# Patient Record
Sex: Male | Born: 1955 | Race: White | Hispanic: No | Marital: Married | Smoking: Former smoker
Health system: Southern US, Community
[De-identification: ages and names within clinical notes are randomized; demographics above are authoritative.]

## PROBLEM LIST (undated history)

## (undated) DIAGNOSIS — E119 Type 2 diabetes mellitus without complications: Secondary | ICD-10-CM

## (undated) DIAGNOSIS — C61 Malignant neoplasm of prostate: Secondary | ICD-10-CM

## (undated) DIAGNOSIS — K219 Gastro-esophageal reflux disease without esophagitis: Secondary | ICD-10-CM

## (undated) DIAGNOSIS — Z9989 Dependence on other enabling machines and devices: Secondary | ICD-10-CM

## (undated) DIAGNOSIS — G4730 Sleep apnea, unspecified: Secondary | ICD-10-CM

## (undated) DIAGNOSIS — Z973 Presence of spectacles and contact lenses: Secondary | ICD-10-CM

## (undated) DIAGNOSIS — M1990 Unspecified osteoarthritis, unspecified site: Secondary | ICD-10-CM

## (undated) HISTORY — PX: OTHER SURGICAL HISTORY: SHX169

## (undated) HISTORY — PX: NASAL SEPTUM SURGERY: SHX37

## (undated) HISTORY — PX: APPENDECTOMY: SHX54

## (undated) HISTORY — PX: TONSILLECTOMY: SUR1361

## (undated) HISTORY — PX: PROSTATE BIOPSY: SHX241

---

## 2003-06-20 ENCOUNTER — Ambulatory Visit (HOSPITAL_BASED_OUTPATIENT_CLINIC_OR_DEPARTMENT_OTHER): Admission: RE | Admit: 2003-06-20 | Discharge: 2003-06-20 | Payer: Self-pay | Admitting: Family Medicine

## 2007-06-02 ENCOUNTER — Emergency Department (HOSPITAL_COMMUNITY): Admission: EM | Admit: 2007-06-02 | Discharge: 2007-06-02 | Payer: Self-pay | Admitting: Emergency Medicine

## 2015-09-09 ENCOUNTER — Ambulatory Visit (INDEPENDENT_AMBULATORY_CARE_PROVIDER_SITE_OTHER): Payer: 59 | Admitting: Podiatry

## 2015-09-09 ENCOUNTER — Encounter: Payer: Self-pay | Admitting: Podiatry

## 2015-09-09 VITALS — BP 152/87 | HR 99 | Resp 18

## 2015-09-09 DIAGNOSIS — Q828 Other specified congenital malformations of skin: Secondary | ICD-10-CM | POA: Diagnosis not present

## 2015-09-09 NOTE — Progress Notes (Signed)
   Subjective:    Patient ID: Dan Gomez, male    DOB: 26-Oct-1955, 60 y.o.   MRN: [id number]  HPI  60 year old male presents the office in 6 concerns of a callus on the outside aspect of the left which is been ongoing the last couple months. He states he is tried to trim the area. Areas painful pressure in shoe gear. Denies any redness, drainage or any swelling. No foreign objects that he can recall stepping on. No other treatment and no other injury.   Review of Systems  All other systems reviewed and are negative.      Objective:   Physical Exam General: AAO x3, NAD  Dermatological: Hyperkeratotic lesion left foot on the lateral fifth metatarsal head over an area of the small tailors bunion. Upon debridement there is pinpoint annular hyperkeratotic lesion. No overlying ulceration. No foreign body. No drainage or pus. Other open lesions.  Vascular: Dorsalis Pedis artery and Posterior Tibial artery pedal pulses are 2/4 bilateral with immedate capillary fill time. Pedal hair growth present. No varicosities and no lower extremity edema present bilateral. There is no pain with calf compression, swelling, warmth, erythema.   Neruologic: Grossly intact via light touch bilateral. Vibratory intact via tuning fork bilateral. Protective threshold with Semmes Wienstein monofilament intact to all pedal sites bilateral. Patellar and Achilles deep tendon reflexes 2+ bilateral. No Babinski or clonus noted bilateral.   Musculoskeletal:  No pain, crepitus, or limitation noted with foot and ankle range of motion bilateral. Muscular strength 5/5 in all groups tested bilateral.  Gait: Unassisted, Nonantalgic.      Assessment & Plan:  60 year old male left foot porokeratosis -Treatment options discussed including all alternatives, risks, and complications -Etiology of symptoms were discussed -Lesion was debrided without complications or bleeding. Area was cleaned. Salicylic acid was applied followed  by a bandage. Post procedure tractions were discussed. Monitor for signs or symptoms of infection. Follow-up as needed. Discussed reoccurrence   Celesta Gentile, DPM

## 2015-12-31 DIAGNOSIS — J339 Nasal polyp, unspecified: Secondary | ICD-10-CM | POA: Diagnosis not present

## 2015-12-31 DIAGNOSIS — M25561 Pain in right knee: Secondary | ICD-10-CM | POA: Diagnosis not present

## 2015-12-31 DIAGNOSIS — M25562 Pain in left knee: Secondary | ICD-10-CM | POA: Diagnosis not present

## 2015-12-31 DIAGNOSIS — Z23 Encounter for immunization: Secondary | ICD-10-CM | POA: Diagnosis not present

## 2015-12-31 DIAGNOSIS — E119 Type 2 diabetes mellitus without complications: Secondary | ICD-10-CM | POA: Diagnosis not present

## 2016-01-15 DIAGNOSIS — G4733 Obstructive sleep apnea (adult) (pediatric): Secondary | ICD-10-CM | POA: Diagnosis not present

## 2016-01-15 DIAGNOSIS — H6121 Impacted cerumen, right ear: Secondary | ICD-10-CM | POA: Diagnosis not present

## 2016-01-15 DIAGNOSIS — J330 Polyp of nasal cavity: Secondary | ICD-10-CM | POA: Diagnosis not present

## 2016-01-15 DIAGNOSIS — H6061 Unspecified chronic otitis externa, right ear: Secondary | ICD-10-CM | POA: Diagnosis not present

## 2016-04-20 DIAGNOSIS — E7800 Pure hypercholesterolemia, unspecified: Secondary | ICD-10-CM | POA: Diagnosis not present

## 2016-04-20 DIAGNOSIS — E119 Type 2 diabetes mellitus without complications: Secondary | ICD-10-CM | POA: Diagnosis not present

## 2016-04-20 DIAGNOSIS — K219 Gastro-esophageal reflux disease without esophagitis: Secondary | ICD-10-CM | POA: Diagnosis not present

## 2016-04-20 DIAGNOSIS — G4730 Sleep apnea, unspecified: Secondary | ICD-10-CM | POA: Diagnosis not present

## 2016-05-04 DIAGNOSIS — G4733 Obstructive sleep apnea (adult) (pediatric): Secondary | ICD-10-CM | POA: Diagnosis not present

## 2016-11-02 DIAGNOSIS — E7800 Pure hypercholesterolemia, unspecified: Secondary | ICD-10-CM | POA: Diagnosis not present

## 2016-11-02 DIAGNOSIS — E119 Type 2 diabetes mellitus without complications: Secondary | ICD-10-CM | POA: Diagnosis not present

## 2016-11-02 DIAGNOSIS — Z125 Encounter for screening for malignant neoplasm of prostate: Secondary | ICD-10-CM | POA: Diagnosis not present

## 2016-11-02 DIAGNOSIS — L308 Other specified dermatitis: Secondary | ICD-10-CM | POA: Diagnosis not present

## 2016-11-02 DIAGNOSIS — K219 Gastro-esophageal reflux disease without esophagitis: Secondary | ICD-10-CM | POA: Diagnosis not present

## 2017-02-08 DIAGNOSIS — Z125 Encounter for screening for malignant neoplasm of prostate: Secondary | ICD-10-CM | POA: Diagnosis not present

## 2017-04-23 DIAGNOSIS — J0100 Acute maxillary sinusitis, unspecified: Secondary | ICD-10-CM | POA: Diagnosis not present

## 2017-04-23 DIAGNOSIS — J0110 Acute frontal sinusitis, unspecified: Secondary | ICD-10-CM | POA: Diagnosis not present

## 2017-04-24 DIAGNOSIS — R972 Elevated prostate specific antigen [PSA]: Secondary | ICD-10-CM | POA: Diagnosis not present

## 2017-04-24 DIAGNOSIS — R350 Frequency of micturition: Secondary | ICD-10-CM | POA: Diagnosis not present

## 2017-05-16 DIAGNOSIS — E119 Type 2 diabetes mellitus without complications: Secondary | ICD-10-CM | POA: Diagnosis not present

## 2017-05-16 DIAGNOSIS — K219 Gastro-esophageal reflux disease without esophagitis: Secondary | ICD-10-CM | POA: Diagnosis not present

## 2017-05-16 DIAGNOSIS — G4730 Sleep apnea, unspecified: Secondary | ICD-10-CM | POA: Diagnosis not present

## 2017-05-16 DIAGNOSIS — E7800 Pure hypercholesterolemia, unspecified: Secondary | ICD-10-CM | POA: Diagnosis not present

## 2017-05-26 DIAGNOSIS — R972 Elevated prostate specific antigen [PSA]: Secondary | ICD-10-CM | POA: Diagnosis not present

## 2017-06-02 DIAGNOSIS — C61 Malignant neoplasm of prostate: Secondary | ICD-10-CM | POA: Diagnosis not present

## 2017-06-16 DIAGNOSIS — J0100 Acute maxillary sinusitis, unspecified: Secondary | ICD-10-CM | POA: Diagnosis not present

## 2017-11-29 DIAGNOSIS — C61 Malignant neoplasm of prostate: Secondary | ICD-10-CM | POA: Diagnosis not present

## 2017-12-06 DIAGNOSIS — K219 Gastro-esophageal reflux disease without esophagitis: Secondary | ICD-10-CM | POA: Diagnosis not present

## 2017-12-06 DIAGNOSIS — Z0000 Encounter for general adult medical examination without abnormal findings: Secondary | ICD-10-CM | POA: Diagnosis not present

## 2017-12-06 DIAGNOSIS — E119 Type 2 diabetes mellitus without complications: Secondary | ICD-10-CM | POA: Diagnosis not present

## 2017-12-06 DIAGNOSIS — M150 Primary generalized (osteo)arthritis: Secondary | ICD-10-CM | POA: Diagnosis not present

## 2017-12-08 DIAGNOSIS — C61 Malignant neoplasm of prostate: Secondary | ICD-10-CM | POA: Diagnosis not present

## 2017-12-08 DIAGNOSIS — R350 Frequency of micturition: Secondary | ICD-10-CM | POA: Diagnosis not present

## 2017-12-08 DIAGNOSIS — N401 Enlarged prostate with lower urinary tract symptoms: Secondary | ICD-10-CM | POA: Diagnosis not present

## 2018-06-14 DIAGNOSIS — C61 Malignant neoplasm of prostate: Secondary | ICD-10-CM | POA: Diagnosis not present

## 2018-06-20 ENCOUNTER — Other Ambulatory Visit: Payer: Self-pay | Admitting: Urology

## 2018-06-20 DIAGNOSIS — R350 Frequency of micturition: Secondary | ICD-10-CM | POA: Diagnosis not present

## 2018-06-20 DIAGNOSIS — N401 Enlarged prostate with lower urinary tract symptoms: Secondary | ICD-10-CM | POA: Diagnosis not present

## 2018-06-20 DIAGNOSIS — C61 Malignant neoplasm of prostate: Secondary | ICD-10-CM

## 2018-06-21 DIAGNOSIS — C61 Malignant neoplasm of prostate: Secondary | ICD-10-CM | POA: Diagnosis not present

## 2018-06-21 DIAGNOSIS — E7800 Pure hypercholesterolemia, unspecified: Secondary | ICD-10-CM | POA: Diagnosis not present

## 2018-06-21 DIAGNOSIS — G4733 Obstructive sleep apnea (adult) (pediatric): Secondary | ICD-10-CM | POA: Diagnosis not present

## 2018-06-21 DIAGNOSIS — E119 Type 2 diabetes mellitus without complications: Secondary | ICD-10-CM | POA: Diagnosis not present

## 2018-07-09 ENCOUNTER — Other Ambulatory Visit: Payer: Self-pay | Admitting: Urology

## 2018-07-10 ENCOUNTER — Ambulatory Visit
Admission: RE | Admit: 2018-07-10 | Discharge: 2018-07-10 | Disposition: A | Discharge status: Home / Self Care | Payer: BLUE CROSS/BLUE SHIELD | Source: Ambulatory Visit | Attending: Urology | Admitting: Urology

## 2018-07-10 ENCOUNTER — Other Ambulatory Visit: Payer: Self-pay

## 2018-07-10 DIAGNOSIS — C61 Malignant neoplasm of prostate: Secondary | ICD-10-CM | POA: Diagnosis not present

## 2018-07-10 MED ORDER — GADOBENATE DIMEGLUMINE 529 MG/ML IV SOLN
20.0000 mL | Freq: Once | INTRAVENOUS | Status: AC | PRN
  Administered 2018-07-10: 20 mL via INTRAVENOUS

## 2018-12-13 DIAGNOSIS — K219 Gastro-esophageal reflux disease without esophagitis: Secondary | ICD-10-CM | POA: Diagnosis not present

## 2018-12-13 DIAGNOSIS — Z23 Encounter for immunization: Secondary | ICD-10-CM | POA: Diagnosis not present

## 2018-12-13 DIAGNOSIS — C61 Malignant neoplasm of prostate: Secondary | ICD-10-CM | POA: Diagnosis not present

## 2018-12-13 DIAGNOSIS — Z0000 Encounter for general adult medical examination without abnormal findings: Secondary | ICD-10-CM | POA: Diagnosis not present

## 2018-12-13 DIAGNOSIS — E119 Type 2 diabetes mellitus without complications: Secondary | ICD-10-CM | POA: Diagnosis not present

## 2018-12-13 DIAGNOSIS — Z13220 Encounter for screening for lipoid disorders: Secondary | ICD-10-CM | POA: Diagnosis not present

## 2019-01-03 DIAGNOSIS — C61 Malignant neoplasm of prostate: Secondary | ICD-10-CM | POA: Diagnosis not present

## 2019-01-03 DIAGNOSIS — R350 Frequency of micturition: Secondary | ICD-10-CM | POA: Diagnosis not present

## 2019-01-03 DIAGNOSIS — N401 Enlarged prostate with lower urinary tract symptoms: Secondary | ICD-10-CM | POA: Diagnosis not present

## 2019-03-07 DIAGNOSIS — E119 Type 2 diabetes mellitus without complications: Secondary | ICD-10-CM | POA: Diagnosis not present

## 2019-03-07 DIAGNOSIS — Z7984 Long term (current) use of oral hypoglycemic drugs: Secondary | ICD-10-CM | POA: Diagnosis not present

## 2019-03-07 DIAGNOSIS — C61 Malignant neoplasm of prostate: Secondary | ICD-10-CM | POA: Diagnosis not present

## 2019-03-07 DIAGNOSIS — E7800 Pure hypercholesterolemia, unspecified: Secondary | ICD-10-CM | POA: Diagnosis not present

## 2019-03-07 DIAGNOSIS — K219 Gastro-esophageal reflux disease without esophagitis: Secondary | ICD-10-CM | POA: Diagnosis not present

## 2019-04-19 HISTORY — PX: PROSTATE BIOPSY: SHX241

## 2019-06-10 DIAGNOSIS — C61 Malignant neoplasm of prostate: Secondary | ICD-10-CM | POA: Diagnosis not present

## 2019-06-19 DIAGNOSIS — C61 Malignant neoplasm of prostate: Secondary | ICD-10-CM | POA: Diagnosis not present

## 2019-06-19 DIAGNOSIS — R350 Frequency of micturition: Secondary | ICD-10-CM | POA: Diagnosis not present

## 2019-06-19 DIAGNOSIS — N401 Enlarged prostate with lower urinary tract symptoms: Secondary | ICD-10-CM | POA: Diagnosis not present

## 2019-07-31 DIAGNOSIS — E7800 Pure hypercholesterolemia, unspecified: Secondary | ICD-10-CM | POA: Diagnosis not present

## 2019-07-31 DIAGNOSIS — E119 Type 2 diabetes mellitus without complications: Secondary | ICD-10-CM | POA: Diagnosis not present

## 2019-07-31 DIAGNOSIS — C61 Malignant neoplasm of prostate: Secondary | ICD-10-CM | POA: Diagnosis not present

## 2019-07-31 DIAGNOSIS — K219 Gastro-esophageal reflux disease without esophagitis: Secondary | ICD-10-CM | POA: Diagnosis not present

## 2019-10-23 DIAGNOSIS — C61 Malignant neoplasm of prostate: Secondary | ICD-10-CM | POA: Diagnosis not present

## 2019-10-23 DIAGNOSIS — R350 Frequency of micturition: Secondary | ICD-10-CM | POA: Diagnosis not present

## 2019-10-23 DIAGNOSIS — N401 Enlarged prostate with lower urinary tract symptoms: Secondary | ICD-10-CM | POA: Diagnosis not present

## 2019-12-11 DIAGNOSIS — C61 Malignant neoplasm of prostate: Secondary | ICD-10-CM | POA: Diagnosis not present

## 2020-01-01 DIAGNOSIS — C61 Malignant neoplasm of prostate: Secondary | ICD-10-CM | POA: Diagnosis not present

## 2020-01-20 ENCOUNTER — Encounter: Payer: Self-pay | Admitting: Radiation Oncology

## 2020-01-20 NOTE — Progress Notes (Signed)
GU Location of Tumor / Histology: prostatic adenocarcinoma  If Prostate Cancer, Gleason Score is (4 + 3) and PSA is (5.61). Prostate volume: 54.51  Dan Gomez reports he has been aware for approximately 2 years of an elevated PSA. Patient reports his understanding that recently he has transitioned to an intermittent risk prostate cancer  Biopsies of prostate (if applicable) revealed:    Past/Anticipated interventions by urology, if any: prostate biopsy, referral for consideration of radiation therapy  Past/Anticipated interventions by medical oncology, if any: no  Weight changes, if any: no  Bowel/Bladder complaints, if any: IPSS 12. SHIM 16. Denies dysuria, hematuria or urinary leakage. Denies urinary incontinence. Denies any bowel complaints.    Nausea/Vomiting, if any: no  Pain issues, if any:  Denies new pain.   SAFETY ISSUES:  Prior radiation? denies  Pacemaker/ICD? denies  Possible current pregnancy? no, male patient  Is the patient on methotrexate? denies  Current Complaints / other details:  64 year old male. Resides in [facility]. Has one daughter and one son. Father with hx of multiple myeloma. Stopped smoking in 1988.

## 2020-01-21 ENCOUNTER — Ambulatory Visit
Admission: RE | Admit: 2020-01-21 | Discharge: 2020-01-21 | Disposition: A | Discharge status: Home / Self Care | Payer: BC Managed Care – PPO | Source: Ambulatory Visit | Attending: Radiation Oncology | Admitting: Radiation Oncology

## 2020-01-21 ENCOUNTER — Encounter: Payer: Self-pay | Admitting: Radiation Oncology

## 2020-01-21 ENCOUNTER — Other Ambulatory Visit: Payer: Self-pay

## 2020-01-21 VITALS — BP 142/82 | HR 95 | Temp 97.6°F | Resp 18 | Ht 70.0 in | Wt 271.4 lb

## 2020-01-21 DIAGNOSIS — C61 Malignant neoplasm of prostate: Secondary | ICD-10-CM | POA: Insufficient documentation

## 2020-01-21 DIAGNOSIS — Z800 Family history of malignant neoplasm of digestive organs: Secondary | ICD-10-CM | POA: Diagnosis not present

## 2020-01-21 DIAGNOSIS — Z7984 Long term (current) use of oral hypoglycemic drugs: Secondary | ICD-10-CM | POA: Insufficient documentation

## 2020-01-21 DIAGNOSIS — Z87891 Personal history of nicotine dependence: Secondary | ICD-10-CM | POA: Insufficient documentation

## 2020-01-21 DIAGNOSIS — R972 Elevated prostate specific antigen [PSA]: Secondary | ICD-10-CM | POA: Diagnosis not present

## 2020-01-21 HISTORY — DX: Malignant neoplasm of prostate: C61

## 2020-01-21 HISTORY — DX: Dependence on other enabling machines and devices: Z99.89

## 2020-01-21 NOTE — Progress Notes (Signed)
Radiation Oncology         [phone]) [phone] ________________________________  Initial outpatient Consultation  Name: Dan Gomez MRN: [id number]  Date: 01/21/2020  DOB: [birth year]  MH:[id number], [name], MD  Festus Aloe, MD   REFERRING PHYSICIAN: Festus Aloe, MD  DIAGNOSIS: 64 y.o. gentleman with Stage T1c adenocarcinoma of the prostate with Gleason score of 4+3, and PSA of 5.61.    ICD-10-CM   1. Malignant neoplasm of prostate ([place])  C61     HISTORY OF PRESENT ILLNESS: Dan Gomez is a 64 y.o. male with a diagnosis of prostate cancer. He was initially referred to Dr. Junious Silk in 04/2017 for an elevated PSA of 6.35.  Repeat PSA during consultation had shown a decrease to 4.61.  Biopsy performed on 05/26/2017 revealed Gleason 3+4 prostate cancer in 3/12 cores with low volume (10-30%).  He opted for active surveillance.  He underwent prostate MRI on 07/10/2018 showing: two right peripheral zone abnormalities, the more suspicious being a diminutive lesion within right mid to apical gland (PI-RADS 4); no findings of locally advanced or pelvic metastatic disease; prostate volume of 44 cc.  He continued on active surveillance.  His PSA remained in the 4 range until 05/2019, when it climbed to 5.76.  Repeat in 10/2019 showed stability at 5.61.  His DRE has remained benign.  The patient proceeded to fusion biopsy on 12/11/2019.  The prostate volume measured 54.51 cc by ultrasound.  Out of 16 core biopsies, 4 were positive.  The maximum Gleason score was 4+3, and this was seen in right apex and right apex lateral, with both also showing perineural invasion. Additionally, Gleason 3+4 disease was seen in both cores taken from the ROI MRI lesion at the right apex, both also with perineural invasion.  The patient reviewed the biopsy results with his urologist and he has kindly been referred today for discussion of potential radiation treatment options.   PREVIOUS RADIATION THERAPY: No  PAST  MEDICAL HISTORY:  Past Medical History:  Diagnosis Date  . CPAP (continuous positive airway pressure) dependence   . Prostate cancer ([place])       PAST SURGICAL HISTORY: Past Surgical History:  Procedure Laterality Date  . PROSTATE BIOPSY      FAMILY HISTORY:  Family History  Problem Relation Age of Onset  . Multiple myeloma Father   . Colon cancer Maternal Grandmother   . Stomach cancer Paternal Grandfather   . Prostate cancer Neg Hx   . Breast cancer Neg Hx   . Pancreatic cancer Neg Hx     SOCIAL HISTORY:  Social History   Socioeconomic History  . Marital status: Married    Spouse name: Not on file  . Number of children: 2  . Years of education: Not on file  . Highest education level: Not on file  Occupational History    Comment: full time  Tobacco Use  . Smoking status: Former Smoker    Packs/day: 1.50    Years: 12.00    Pack years: 18.00    Types: Cigarettes    Quit date: 04/18/1986    Years since quitting: 33.8  . Smokeless tobacco: Never Used  Vaping Use  . Vaping Use: Never used  Substance and Sexual Activity  . Alcohol use: No    Alcohol/week: 0.0 standard drinks  . Drug use: No  . Sexual activity: Yes  Other Topics Concern  . Not on file  Social History Narrative   2 children plus 1 stepchild   Social Determinants  of Health   Financial Resource Strain:   . Difficulty of Paying Living Expenses: Not on file  Food Insecurity:   . Worried About Charity fundraiser in the Last Year: Not on file  . Ran Out of Food in the Last Year: Not on file  Transportation Needs:   . Lack of Transportation (Medical): Not on file  . Lack of Transportation (Non-Medical): Not on file  Physical Activity:   . Days of Exercise per Week: Not on file  . Minutes of Exercise per Session: Not on file  Stress:   . Feeling of Stress : Not on file  Social Connections:   . Frequency of Communication with Friends and Family: Not on file  . Frequency of Social Gatherings with  Friends and Family: Not on file  . Attends Religious Services: Not on file  . Active Member of Clubs or Organizations: Not on file  . Attends Archivist Meetings: Not on file  . Marital Status: Not on file  Intimate Partner Violence:   . Fear of Current or Ex-Partner: Not on file  . Emotionally Abused: Not on file  . Physically Abused: Not on file  . Sexually Abused: Not on file    ALLERGIES: Bactrim [sulfamethoxazole-trimethoprim]  MEDICATIONS:  Current Outpatient Medications  Medication Sig Dispense Refill  . JARDIANCE 25 MG TABS tablet Take 25 mg by mouth daily.    . metFORMIN (GLUCOPHAGE) 500 MG tablet Take 500 mg by mouth daily with breakfast. Patient was unsure of the mg/Lisa      No current facility-administered medications for this encounter.    REVIEW OF SYSTEMS:  On review of systems, the patient reports that he is doing well overall. He denies any chest pain, shortness of breath, cough, fevers, chills, night sweats, unintended weight changes. He denies any bowel disturbances, and denies abdominal pain, nausea or vomiting. He denies any new musculoskeletal or joint aches or pains. His IPSS was 12, indicating moderate urinary symptoms. His SHIM was 16, indicating he has moderate erectile dysfunction. A complete review of systems is obtained and is otherwise negative.    PHYSICAL EXAM:  Wt Readings from Last 3 Encounters:  01/21/20 271 lb 6 oz (123.1 kg)   Temp Readings from Last 3 Encounters:  01/21/20 97.6 F (36.4 C) (Tympanic)   BP Readings from Last 3 Encounters:  01/21/20 (!) 142/82  09/09/15 (!) 152/87   Pulse Readings from Last 3 Encounters:  01/21/20 95  09/09/15 99   Pain Assessment Pain Score: 0-No pain/10  In general this is a well appearing Caucasian male in no acute distress. He is alert and oriented x4 and appropriate throughout the examination. HEENT reveals that the patient is normocephalic, atraumatic. EOMs are intact. PERRLA. Skin is  intact without any evidence of gross lesions. Cardiopulmonary assessment is negative for acute distress and he exhibits normal effort.  Abdomen has active bowel sounds in all quadrants and is intact. The abdomen is soft, non tender, non distended. Lower extremities are negative for pretibial pitting edema, deep calf tenderness, cyanosis or clubbing.   KPS = 90  100 - Normal; no complaints; no evidence of disease. 90   - Able to carry on normal activity; minor signs or symptoms of disease. 80   - Normal activity with effort; some signs or symptoms of disease. 18   - Cares for self; unable to carry on normal activity or to do active work. 60   - Requires occasional assistance, but is  able to care for most of his personal needs. 50   - Requires considerable assistance and frequent medical care. 16   - Disabled; requires special care and assistance. 77   - Severely disabled; hospital admission is indicated although death not imminent. 80   - Very sick; hospital admission necessary; active supportive treatment necessary. 10   - Moribund; fatal processes progressing rapidly. 0     - Dead  Karnofsky DA, Abelmann WH, Craver LS and Burchenal JH [phone]) The use of the nitrogen mustards in the palliative treatment of carcinoma: with particular reference to bronchogenic carcinoma Cancer 1 634-56  LABORATORY DATA:  No results found for: WBC, HGB, HCT, MCV, PLT No results found for: NA, K, CL, CO2 No results found for: ALT, AST, GGT, ALKPHOS, BILITOT   RADIOGRAPHY: No results found.    IMPRESSION/PLAN: 1. 64 y.o. gentleman with Stage T1c adenocarcinoma of the prostate with Gleason Score of 4+3, and PSA of 5.61. We discussed the patient's workup and outlined the nature of prostate cancer in this setting. The patient's T stage, Gleason's score, and PSA put him into the unfavorable intermediate risk group. Accordingly, he is eligible for a variety of potential treatment options including brachytherapy,  5.5 weeks of external radiation, or prostatectomy. We discussed the available radiation techniques, and focused on the details and logistics of delivery. We discussed and outlined the risks, benefits, short and long-term effects associated with radiotherapy and compared and contrasted these with prostatectomy. We discussed the role of SpaceOAR gel in reducing the rectal toxicity associated with radiotherapy. We also detailed the role of ADT in the treatment of higher risk prostate cancer and outlined the associated side effects that could be expected with this therapy. Given the small volume of 4+3 disease on biopsy, we feel that the negative impact on quality of life secondary to side effects, outweighs the minimal potential benefit and therefore do not recommend concurrent ADT.  He appears to have a good understanding of his disease and our treatment recommendations which are of curative intent.  He was encouraged to ask questions that were answered to his stated satisfaction.  At the conclusion of our conversation, the patient is interested in moving forward with brachytherapy and use of SpaceOAR gel to reduce rectal toxicity from radiotherapy.  We will share our discussion with Dr. Junious Silk and move forward with scheduling his [facility] planning appointment in the near future.  The patient with be contacted by Romie Jumper in our office who will be working closely with him to coordinate OR scheduling and pre and post procedure appointments.  We will contact the pharmaceutical rep to ensure that [place] is available at the time of procedure.  We enjoyed meeting him today and look forward to continuing to participate in his care.Nicholos Johns, PA-C    Tyler Pita, MD  [facility] Direct Dial: [phone]  Fax: [phone] [email or web address]  Skype  [email or web address]   This document serves as a record of services personally performed by Tyler Pita, MD and Freeman Caldron, PA-C. It was created on their behalf by Wilburn Mylar, a trained medical scribe. The creation of this record is based on the scribe's personal observations and the provider's statements to them. This document has been checked and approved by the attending provider.

## 2020-01-22 ENCOUNTER — Telehealth: Payer: Self-pay | Admitting: *Deleted

## 2020-01-22 NOTE — Telephone Encounter (Signed)
Called patient to ask questions, spoke with patient 

## 2020-01-27 ENCOUNTER — Telehealth: Payer: Self-pay | Admitting: *Deleted

## 2020-01-27 NOTE — Telephone Encounter (Signed)
CALLED PATIENT TO UPDATE, SPOKE WITH PATIENT 

## 2020-01-30 ENCOUNTER — Other Ambulatory Visit: Payer: Self-pay | Admitting: Urology

## 2020-01-31 ENCOUNTER — Telehealth: Payer: Self-pay | Admitting: *Deleted

## 2020-01-31 NOTE — Telephone Encounter (Signed)
CALLED PATIENT TO INFORM OF IMPLANT DATE, SPOKE WITH PATIENT AND HE IS AWARE OF THIS DATE 

## 2020-02-05 ENCOUNTER — Telehealth: Payer: Self-pay | Admitting: *Deleted

## 2020-02-05 NOTE — Telephone Encounter (Signed)
CALLED PATIENT TO ASK ABOUT WAITING UNTIL 12-21 TO DO PRE-SEED APPTS. DUE TO THE FACT THAT HE WANTS TO WAIT UNTIL NEXT YEAR TO DO THE IMPLANT

## 2020-02-06 ENCOUNTER — Ambulatory Visit: Payer: BC Managed Care – PPO | Admitting: Radiation Oncology

## 2020-02-06 ENCOUNTER — Encounter (HOSPITAL_COMMUNITY): Payer: BC Managed Care – PPO

## 2020-02-06 ENCOUNTER — Ambulatory Visit: Payer: BC Managed Care – PPO | Admitting: Urology

## 2020-02-06 ENCOUNTER — Encounter: Payer: Self-pay | Admitting: Medical Oncology

## 2020-02-13 NOTE — Progress Notes (Signed)
Called patient to introduce myself as the prostate nurse navigator and discuss my role. He has chosen brachytherapy to treat his prostate cancer. He would like to wait until the first of the year to proceed. I asked him to call me with questions or concerns.

## 2020-03-11 ENCOUNTER — Other Ambulatory Visit: Payer: Self-pay | Admitting: Urology

## 2020-03-20 ENCOUNTER — Ambulatory Visit (HOSPITAL_BASED_OUTPATIENT_CLINIC_OR_DEPARTMENT_OTHER): Admit: 2020-03-20 | Payer: BC Managed Care – PPO | Admitting: Urology

## 2020-03-20 ENCOUNTER — Encounter (HOSPITAL_BASED_OUTPATIENT_CLINIC_OR_DEPARTMENT_OTHER): Payer: Self-pay

## 2020-03-20 SURGERY — INSERTION, RADIATION SOURCE, PROSTATE
Anesthesia: General

## 2020-04-16 ENCOUNTER — Ambulatory Visit: Payer: BC Managed Care – PPO | Admitting: Radiation Oncology

## 2020-04-16 ENCOUNTER — Ambulatory Visit: Payer: Self-pay | Admitting: Urology

## 2020-04-22 ENCOUNTER — Telehealth: Payer: Self-pay | Admitting: *Deleted

## 2020-04-22 NOTE — Telephone Encounter (Signed)
CALLED PATIENT TO REMIND OF PRE-SEED APPTS. FOR 04-23-20, SPOKE WITH PATIENT AND HE IS AWARE OF THESE APPTS.

## 2020-04-22 NOTE — Progress Notes (Signed)
  Radiation Oncology         [phone] ________________________________  Name: Dan Gomez MRN: [id number]  Date: 04/23/2020  DOB: Dec 24, 1955  SIMULATION AND TREATMENT PLANNING NOTE PUBIC ARCH STUDY  MG:NOIBBC, Lum Keas, MD  Jerilee Field, MD  DIAGNOSIS: 65 y.o. gentleman with Stage T1c adenocarcinoma of the prostate with Gleason score of 4+3, and PSA of 5.61.  Oncology History   No history exists.      ICD-10-CM   1. Malignant neoplasm of prostate ([facility])  C61     COMPLEX SIMULATION:  The patient presented today for evaluation for possible prostate seed implant. He was brought to the radiation planning suite and placed supine on the CT couch. A 3-dimensional image study set was obtained in upload to the planning computer. There, on each axial slice, I contoured the prostate gland. Then, using three-dimensional radiation planning tools I reconstructed the prostate in view of the structures from the transperineal needle pathway to assess for possible pubic arch interference. In doing so, I did not appreciate any pubic arch interference. Also, the patient's prostate volume was estimated based on the drawn structure. The volume was 54 cc.  Given the pubic arch appearance and prostate volume, patient remains a good candidate to proceed with prostate seed implant. Today, he freely provided informed written consent to proceed.    PLAN: The patient will undergo prostate seed implant.   ________________________________  Artist Pais. Kathrynn Running, M.D.

## 2020-04-23 ENCOUNTER — Encounter: Payer: Self-pay | Admitting: Medical Oncology

## 2020-04-23 ENCOUNTER — Ambulatory Visit
Admission: RE | Admit: 2020-04-23 | Discharge: 2020-04-23 | Disposition: A | Discharge status: Home / Self Care | Payer: BC Managed Care – PPO | Source: Ambulatory Visit | Attending: Urology | Admitting: Urology

## 2020-04-23 ENCOUNTER — Other Ambulatory Visit: Payer: Self-pay

## 2020-04-23 ENCOUNTER — Ambulatory Visit
Admission: RE | Admit: 2020-04-23 | Discharge: 2020-04-23 | Disposition: A | Discharge status: Home / Self Care | Payer: BC Managed Care – PPO | Source: Ambulatory Visit | Attending: Radiation Oncology | Admitting: Radiation Oncology

## 2020-04-23 ENCOUNTER — Encounter (HOSPITAL_COMMUNITY)
Admission: RE | Admit: 2020-04-23 | Discharge: 2020-04-23 | Disposition: A | Discharge status: Home / Self Care | Payer: BC Managed Care – PPO | Source: Ambulatory Visit | Attending: Urology | Admitting: Urology

## 2020-04-23 ENCOUNTER — Ambulatory Visit (HOSPITAL_COMMUNITY)
Admission: RE | Admit: 2020-04-23 | Discharge: 2020-04-23 | Disposition: A | Discharge status: Home / Self Care | Payer: BC Managed Care – PPO | Source: Ambulatory Visit | Attending: Urology | Admitting: Urology

## 2020-04-23 DIAGNOSIS — C61 Malignant neoplasm of prostate: Secondary | ICD-10-CM

## 2020-04-23 DIAGNOSIS — Z01818 Encounter for other preprocedural examination: Secondary | ICD-10-CM | POA: Diagnosis not present

## 2020-05-11 ENCOUNTER — Telehealth: Payer: Self-pay | Admitting: *Deleted

## 2020-05-11 NOTE — Telephone Encounter (Signed)
Called patient to remnd of lab and Covid testing for 05-13-20, spoke with patient and he is aware of these appts

## 2020-05-13 ENCOUNTER — Other Ambulatory Visit: Payer: Self-pay

## 2020-05-13 ENCOUNTER — Encounter (HOSPITAL_BASED_OUTPATIENT_CLINIC_OR_DEPARTMENT_OTHER): Payer: Self-pay | Admitting: Urology

## 2020-05-13 ENCOUNTER — Encounter (HOSPITAL_COMMUNITY)
Admission: RE | Admit: 2020-05-13 | Discharge: 2020-05-13 | Disposition: A | Discharge status: Home / Self Care | Payer: BC Managed Care – PPO | Source: Ambulatory Visit | Attending: Urology | Admitting: Urology

## 2020-05-13 ENCOUNTER — Other Ambulatory Visit (HOSPITAL_COMMUNITY)
Admission: RE | Admit: 2020-05-13 | Discharge: 2020-05-13 | Disposition: A | Discharge status: Home / Self Care | Payer: BC Managed Care – PPO | Source: Ambulatory Visit | Attending: Urology | Admitting: Urology

## 2020-05-13 ENCOUNTER — Other Ambulatory Visit (HOSPITAL_COMMUNITY): Payer: BC Managed Care – PPO

## 2020-05-13 DIAGNOSIS — Z6839 Body mass index (BMI) 39.0-39.9, adult: Secondary | ICD-10-CM | POA: Diagnosis not present

## 2020-05-13 DIAGNOSIS — Z20822 Contact with and (suspected) exposure to covid-19: Secondary | ICD-10-CM | POA: Insufficient documentation

## 2020-05-13 DIAGNOSIS — Z79899 Other long term (current) drug therapy: Secondary | ICD-10-CM | POA: Diagnosis not present

## 2020-05-13 DIAGNOSIS — Z800 Family history of malignant neoplasm of digestive organs: Secondary | ICD-10-CM | POA: Diagnosis not present

## 2020-05-13 DIAGNOSIS — C61 Malignant neoplasm of prostate: Secondary | ICD-10-CM | POA: Diagnosis not present

## 2020-05-13 DIAGNOSIS — Z807 Family history of other malignant neoplasms of lymphoid, hematopoietic and related tissues: Secondary | ICD-10-CM | POA: Diagnosis not present

## 2020-05-13 DIAGNOSIS — Z87891 Personal history of nicotine dependence: Secondary | ICD-10-CM | POA: Diagnosis not present

## 2020-05-13 DIAGNOSIS — Z7984 Long term (current) use of oral hypoglycemic drugs: Secondary | ICD-10-CM | POA: Diagnosis not present

## 2020-05-13 DIAGNOSIS — Z881 Allergy status to other antibiotic agents status: Secondary | ICD-10-CM | POA: Diagnosis not present

## 2020-05-13 DIAGNOSIS — Z01812 Encounter for preprocedural laboratory examination: Secondary | ICD-10-CM | POA: Insufficient documentation

## 2020-05-13 LAB — COMPREHENSIVE METABOLIC PANEL
ALT: 59 U/L — ABNORMAL HIGH (ref 0–44)
AST: 40 U/L (ref 15–41)
Albumin: 3.9 g/dL (ref 3.5–5.0)
Alkaline Phosphatase: 77 U/L (ref 38–126)
Anion gap: 11 (ref 5–15)
BUN: 14 mg/dL (ref 8–23)
CO2: 26 mmol/L (ref 22–32)
Calcium: 9 mg/dL (ref 8.9–10.3)
Chloride: 103 mmol/L (ref 98–111)
Creatinine, Ser: 0.98 mg/dL (ref 0.61–1.24)
GFR, Estimated: >60 mL/min (ref >60)
Glucose, Bld: 158 mg/dL — ABNORMAL HIGH (ref 70–99)
Potassium: 4.5 mmol/L (ref 3.5–5.1)
Sodium: 140 mmol/L (ref 135–145)
Total Bilirubin: 0.6 mg/dL (ref 0.3–1.2)
Total Protein: 7.2 g/dL (ref 6.5–8.1)

## 2020-05-13 LAB — CBC
HCT: 51.8 % (ref 39.0–52.0)
Hemoglobin: 17.4 g/dL — ABNORMAL HIGH (ref 13.0–17.0)
MCH: 29.7 pg (ref 26.0–34.0)
MCHC: 33.6 g/dL (ref 30.0–36.0)
MCV: 88.5 fL (ref 80.0–100.0)
Platelets: 267 10*3/uL (ref 150–400)
RBC: 5.85 MIL/uL — ABNORMAL HIGH (ref 4.22–5.81)
RDW: 14.8 % (ref 11.5–15.5)
WBC: 8.1 10*3/uL (ref 4.0–10.5)
nRBC: 0 % (ref 0.0–0.2)

## 2020-05-13 LAB — PROTIME-INR
INR: 1.1 (ref 0.8–1.2)
Prothrombin Time: 13.6 seconds (ref 11.4–15.2)

## 2020-05-13 LAB — APTT: aPTT: 28 seconds (ref 24–36)

## 2020-05-13 LAB — SARS CORONAVIRUS 2 (TAT 6-24 HRS): SARS Coronavirus 2: NEGATIVE

## 2020-05-13 NOTE — Progress Notes (Signed)
Spoke w/ via phone for pre-op interview---pt Lab needs dos----   none            Lab results------ekg 04-23-2020 epic, chest xray 04-23-2020 epic, cbc, cmet, pt, ptt  05-13-2020 epic COVID test ------05-13-2020 1000 Arrive at -------700 am NPO after MN NO Solid Food.  Clear liquids from MN until---600 am then npo Medications to take morning of surgery -----prevacid Diabetic medication -----none day of surgery Patient Special Instructions -----fleets enema day of surgery Pre-Op special Istructions -----bring cpap mask tubing and machine day of surgery Patient verbalized understanding of instructions that were given at this phone interview. Patient denies shortness of breath, chest pain, fever, cough at this phone interview.

## 2020-05-14 ENCOUNTER — Telehealth: Payer: Self-pay | Admitting: *Deleted

## 2020-05-14 NOTE — Progress Notes (Signed)
  Radiation Oncology         [phone] ________________________________  Name: Dan Gomez MRN: [id number]  Date: 05/14/2020  DOB: 03/05/1956       Prostate Seed Implant  TT:[id number], [name], MD  No ref. provider found  DIAGNOSIS:   65 y.o. gentleman with Stage T1c adenocarcinoma of the prostate with Gleason score of 4+3, and PSA of 5.61  PROCEDURE: Insertion of radioactive I-125 seeds into the prostate gland.  RADIATION DOSE: 145 Gy, definitive therapy.  TECHNIQUE: Dan Gomez was brought to the operating room with the urologist. He was placed in the dorsolithotomy position. He was catheterized and a rectal tube was inserted. The perineum was shaved, prepped and draped. The ultrasound probe was then introduced into the rectum to see the prostate gland.  TREATMENT DEVICE: A needle grid was attached to the ultrasound probe stand and anchor needles were placed.  3D PLANNING: The prostate was imaged in 3D using a sagittal sweep of the prostate probe. These images were transferred to the planning computer. There, the prostate, urethra and rectum were defined on each axial reconstructed image. Then, the software created an optimized 3D plan and a few seed positions were adjusted. The quality of the plan was reviewed using [facility] information for the target and the following two organs at risk:  Urethra and Rectum.  Then the accepted plan was printed and handed off to the radiation therapist.  Under my supervision, the custom loading of the seeds and spacers was carried out and loaded into sealed vicryl sleeves.  These pre-loaded needles were then placed into the needle holder.Marland Kitchen  PROSTATE VOLUME STUDY:  Using transrectal ultrasound the volume of the prostate was verified to be 58.9 cc.  SPECIAL TREATMENT PROCEDURE/SUPERVISION AND HANDLING: The pre-loaded needles were then delivered under sagittal guidance. A total of 19 needles were used to deposit 69 seeds in the prostate gland. The  individual seed activity was 0.571 mCi.  SpaceOAR:  Yes  COMPLEX SIMULATION: At the end of the procedure, an anterior radiograph of the pelvis was obtained to document seed positioning and count. Cystoscopy was performed to check the urethra and bladder.  MICRODOSIMETRY: At the end of the procedure, the patient was emitting 0.108 mR/hr at 1 meter. Accordingly, he was considered safe for hospital discharge.  PLAN: The patient will return to the radiation oncology clinic for post implant CT dosimetry in three weeks.   ________________________________  Sheral Apley Tammi Klippel, M.D.

## 2020-05-14 NOTE — Telephone Encounter (Signed)
CALLED PATIENT TO REMIND OF PROCEDURE FOR 05-15-20, SPOKE WITH PATIENT AND HE IS AWARE OF THIS PROCEDURE

## 2020-05-15 ENCOUNTER — Ambulatory Visit (HOSPITAL_BASED_OUTPATIENT_CLINIC_OR_DEPARTMENT_OTHER): Payer: BC Managed Care – PPO | Admitting: Anesthesiology

## 2020-05-15 ENCOUNTER — Encounter (HOSPITAL_BASED_OUTPATIENT_CLINIC_OR_DEPARTMENT_OTHER): Payer: Self-pay | Admitting: Urology

## 2020-05-15 ENCOUNTER — Encounter (HOSPITAL_BASED_OUTPATIENT_CLINIC_OR_DEPARTMENT_OTHER)
Admission: RE | Disposition: A | Discharge status: Home / Self Care | Payer: Self-pay | Source: Home / Self Care | Attending: Urology

## 2020-05-15 ENCOUNTER — Ambulatory Visit (HOSPITAL_COMMUNITY): Payer: BC Managed Care – PPO

## 2020-05-15 ENCOUNTER — Ambulatory Visit (HOSPITAL_BASED_OUTPATIENT_CLINIC_OR_DEPARTMENT_OTHER)
Admission: RE | Admit: 2020-05-15 | Discharge: 2020-05-15 | Disposition: A | Discharge status: Home / Self Care | Payer: BC Managed Care – PPO | Attending: Urology | Admitting: Urology

## 2020-05-15 DIAGNOSIS — Z7984 Long term (current) use of oral hypoglycemic drugs: Secondary | ICD-10-CM | POA: Diagnosis not present

## 2020-05-15 DIAGNOSIS — Z79899 Other long term (current) drug therapy: Secondary | ICD-10-CM | POA: Diagnosis not present

## 2020-05-15 DIAGNOSIS — Z87891 Personal history of nicotine dependence: Secondary | ICD-10-CM | POA: Insufficient documentation

## 2020-05-15 DIAGNOSIS — Z807 Family history of other malignant neoplasms of lymphoid, hematopoietic and related tissues: Secondary | ICD-10-CM | POA: Insufficient documentation

## 2020-05-15 DIAGNOSIS — G4730 Sleep apnea, unspecified: Secondary | ICD-10-CM | POA: Diagnosis not present

## 2020-05-15 DIAGNOSIS — Z20822 Contact with and (suspected) exposure to covid-19: Secondary | ICD-10-CM | POA: Diagnosis not present

## 2020-05-15 DIAGNOSIS — K219 Gastro-esophageal reflux disease without esophagitis: Secondary | ICD-10-CM | POA: Diagnosis not present

## 2020-05-15 DIAGNOSIS — C61 Malignant neoplasm of prostate: Secondary | ICD-10-CM | POA: Diagnosis not present

## 2020-05-15 DIAGNOSIS — Z800 Family history of malignant neoplasm of digestive organs: Secondary | ICD-10-CM | POA: Insufficient documentation

## 2020-05-15 DIAGNOSIS — E119 Type 2 diabetes mellitus without complications: Secondary | ICD-10-CM | POA: Diagnosis not present

## 2020-05-15 DIAGNOSIS — Z881 Allergy status to other antibiotic agents status: Secondary | ICD-10-CM | POA: Diagnosis not present

## 2020-05-15 DIAGNOSIS — Z6839 Body mass index (BMI) 39.0-39.9, adult: Secondary | ICD-10-CM | POA: Insufficient documentation

## 2020-05-15 HISTORY — DX: Type 2 diabetes mellitus without complications: E11.9

## 2020-05-15 HISTORY — DX: Unspecified osteoarthritis, unspecified site: M19.90

## 2020-05-15 HISTORY — PX: SPACE OAR INSTILLATION: SHX6769

## 2020-05-15 HISTORY — DX: Gastro-esophageal reflux disease without esophagitis: K21.9

## 2020-05-15 HISTORY — DX: Presence of spectacles and contact lenses: Z97.3

## 2020-05-15 HISTORY — DX: Sleep apnea, unspecified: G47.30

## 2020-05-15 HISTORY — PX: RADIOACTIVE SEED IMPLANT: SHX5150

## 2020-05-15 LAB — GLUCOSE, CAPILLARY
Glucose-Capillary: 160 mg/dL — ABNORMAL HIGH (ref 70–99)
Glucose-Capillary: 190 mg/dL — ABNORMAL HIGH (ref 70–99)

## 2020-05-15 SURGERY — INSERTION, RADIATION SOURCE, PROSTATE
Anesthesia: General | Site: Rectum

## 2020-05-15 MED ORDER — OXYCODONE HCL 5 MG/5ML PO SOLN: 5.0000 mg | Freq: Once | ORAL | Status: DC | PRN

## 2020-05-15 MED ORDER — ONDANSETRON HCL 4 MG/2ML IJ SOLN
INTRAMUSCULAR | Status: AC
  Filled 2020-05-15: qty 2

## 2020-05-15 MED ORDER — OXYCODONE HCL 5 MG PO TABS: 5.0000 mg | ORAL_TABLET | Freq: Once | ORAL | Status: DC | PRN

## 2020-05-15 MED ORDER — ONDANSETRON HCL 4 MG/2ML IJ SOLN
INTRAMUSCULAR | Status: DC | PRN
  Administered 2020-05-15: 4 mg via INTRAVENOUS

## 2020-05-15 MED ORDER — PROPOFOL 10 MG/ML IV BOLUS
INTRAVENOUS | Status: AC
  Filled 2020-05-15: qty 20

## 2020-05-15 MED ORDER — FENTANYL CITRATE (PF) 100 MCG/2ML IJ SOLN
INTRAMUSCULAR | Status: AC
  Filled 2020-05-15: qty 2

## 2020-05-15 MED ORDER — MIDAZOLAM HCL 5 MG/5ML IJ SOLN
INTRAMUSCULAR | Status: DC | PRN
  Administered 2020-05-15: 2 mg via INTRAVENOUS

## 2020-05-15 MED ORDER — SODIUM CHLORIDE (PF) 0.9 % IJ SOLN
INTRAMUSCULAR | Status: DC | PRN
  Administered 2020-05-15: 50 mL

## 2020-05-15 MED ORDER — LIDOCAINE HCL (CARDIAC) PF 100 MG/5ML IV SOSY
PREFILLED_SYRINGE | INTRAVENOUS | Status: DC | PRN
  Administered 2020-05-15: 40 mg via INTRAVENOUS
  Administered 2020-05-15: 60 mg via INTRAVENOUS

## 2020-05-15 MED ORDER — CIPROFLOXACIN IN D5W 400 MG/200ML IV SOLN
400.0000 mg | INTRAVENOUS | Status: AC
  Administered 2020-05-15: 400 mg via INTRAVENOUS

## 2020-05-15 MED ORDER — LACTATED RINGERS IV SOLN: INTRAVENOUS | Status: DC

## 2020-05-15 MED ORDER — IOHEXOL 300 MG/ML  SOLN
INTRAMUSCULAR | Status: DC | PRN
  Administered 2020-05-15: 5 mL via URETHRAL

## 2020-05-15 MED ORDER — HYDROCODONE-ACETAMINOPHEN 7.5-325 MG PO TABS: 1.0000 | ORAL_TABLET | Freq: Four times a day (QID) | ORAL | 0 refills | Status: DC | PRN

## 2020-05-15 MED ORDER — DEXAMETHASONE SODIUM PHOSPHATE 4 MG/ML IJ SOLN
INTRAMUSCULAR | Status: DC | PRN
  Administered 2020-05-15: 5 mg via INTRAVENOUS

## 2020-05-15 MED ORDER — SUCCINYLCHOLINE CHLORIDE 200 MG/10ML IV SOSY
PREFILLED_SYRINGE | INTRAVENOUS | Status: AC
  Filled 2020-05-15: qty 10

## 2020-05-15 MED ORDER — PHENYLEPHRINE 40 MCG/ML (10ML) SYRINGE FOR IV PUSH (FOR BLOOD PRESSURE SUPPORT)
PREFILLED_SYRINGE | INTRAVENOUS | Status: DC | PRN
  Administered 2020-05-15: 80 ug via INTRAVENOUS

## 2020-05-15 MED ORDER — CEPHALEXIN 500 MG PO CAPS: 500.0000 mg | ORAL_CAPSULE | Freq: Two times a day (BID) | ORAL | 0 refills | Status: DC

## 2020-05-15 MED ORDER — SODIUM CHLORIDE 0.9 % IV SOLN
INTRAVENOUS | Status: AC | PRN
  Administered 2020-05-15: 1000 mL

## 2020-05-15 MED ORDER — FENTANYL CITRATE (PF) 100 MCG/2ML IJ SOLN: 25.0000 ug | INTRAMUSCULAR | Status: DC | PRN

## 2020-05-15 MED ORDER — MIDAZOLAM HCL 2 MG/2ML IJ SOLN
INTRAMUSCULAR | Status: AC
  Filled 2020-05-15: qty 2

## 2020-05-15 MED ORDER — ALFUZOSIN HCL ER 10 MG PO TB24: 10.0000 mg | ORAL_TABLET | Freq: Every day | ORAL | 0 refills | Status: AC

## 2020-05-15 MED ORDER — FLEET ENEMA 7-19 GM/118ML RE ENEM: 1.0000 | ENEMA | Freq: Once | RECTAL | Status: DC

## 2020-05-15 MED ORDER — LIDOCAINE HCL (PF) 2 % IJ SOLN
INTRAMUSCULAR | Status: AC
  Filled 2020-05-15: qty 5

## 2020-05-15 MED ORDER — DEXAMETHASONE SODIUM PHOSPHATE 10 MG/ML IJ SOLN
INTRAMUSCULAR | Status: AC
  Filled 2020-05-15: qty 1

## 2020-05-15 MED ORDER — AMISULPRIDE (ANTIEMETIC) 5 MG/2ML IV SOLN: 10.0000 mg | Freq: Once | INTRAVENOUS | Status: DC | PRN

## 2020-05-15 MED ORDER — PROPOFOL 10 MG/ML IV BOLUS
INTRAVENOUS | Status: DC | PRN
  Administered 2020-05-15: 200 mg via INTRAVENOUS
  Administered 2020-05-15: 100 mg via INTRAVENOUS

## 2020-05-15 MED ORDER — FENTANYL CITRATE (PF) 100 MCG/2ML IJ SOLN
INTRAMUSCULAR | Status: DC | PRN
  Administered 2020-05-15 (×2): 50 ug via INTRAVENOUS

## 2020-05-15 MED ORDER — ONDANSETRON HCL 4 MG/2ML IJ SOLN: 4.0000 mg | Freq: Once | INTRAMUSCULAR | Status: DC | PRN

## 2020-05-15 MED ORDER — CIPROFLOXACIN IN D5W 400 MG/200ML IV SOLN
INTRAVENOUS | Status: AC
  Filled 2020-05-15: qty 200

## 2020-05-15 MED ORDER — SUCCINYLCHOLINE CHLORIDE 20 MG/ML IJ SOLN
INTRAMUSCULAR | Status: DC | PRN
  Administered 2020-05-15: 160 mg via INTRAVENOUS

## 2020-05-15 SURGICAL SUPPLY — 39 items
BAG DRN RND TRDRP ANRFLXCHMBR (UROLOGICAL SUPPLIES) ×1
BAG URINE DRAIN 2000ML AR STRL (UROLOGICAL SUPPLIES) ×2 IMPLANT
BLADE CLIPPER SENSICLIP SURGIC (BLADE) ×2 IMPLANT
CATH FOLEY 2WAY SLVR  5CC 16FR (CATHETERS) ×1
CATH FOLEY 2WAY SLVR  5CC 20FR (CATHETERS) ×1
CATH FOLEY 2WAY SLVR 5CC 16FR (CATHETERS) ×1 IMPLANT
CATH FOLEY 2WAY SLVR 5CC 20FR (CATHETERS) ×1 IMPLANT
CATH ROBINSON RED A/P 16FR (CATHETERS) IMPLANT
CATH ROBINSON RED A/P 20FR (CATHETERS) ×2 IMPLANT
CLOTH BEACON ORANGE TIMEOUT ST (SAFETY) ×2 IMPLANT
CNTNR URN SCR LID CUP LEK RST (MISCELLANEOUS) ×2 IMPLANT
CONT SPEC 4OZ STRL OR WHT (MISCELLANEOUS) ×4
COVER BACK TABLE 60X90IN (DRAPES) ×2 IMPLANT
COVER MAYO STAND STRL (DRAPES) ×2 IMPLANT
DRAPE C-ARM 35X43 STRL (DRAPES) ×2 IMPLANT
DRSG TEGADERM 4X4.75 (GAUZE/BANDAGES/DRESSINGS) ×4 IMPLANT
DRSG TEGADERM 8X12 (GAUZE/BANDAGES/DRESSINGS) ×4 IMPLANT
GLOVE BIO SURGEON STRL SZ7.5 (GLOVE) ×2 IMPLANT
GLOVE BIO SURGEON STRL SZ8 (GLOVE) IMPLANT
GLOVE SURG ENC MOIS LTX SZ6.5 (GLOVE) ×2 IMPLANT
GLOVE SURG ORTHO 8.5 STRL (GLOVE) ×2 IMPLANT
GLOVE SURG SS PI 6.5 STRL IVOR (GLOVE) IMPLANT
GOWN STRL REUS W/TWL LRG LVL3 (GOWN DISPOSABLE) ×2 IMPLANT
HOLDER FOLEY CATH W/STRAP (MISCELLANEOUS) ×2 IMPLANT
I-Seed AgX100 ×2 IMPLANT
IMPL SPACEOAR SYSTEM 10ML (Spacer) ×1 IMPLANT
IMPLANT SPACEOAR SYSTEM 10ML (Spacer) ×2 IMPLANT
IV NS 1000ML (IV SOLUTION) ×2
IV NS 1000ML BAXH (IV SOLUTION) ×1 IMPLANT
KIT TURNOVER CYSTO (KITS) ×2 IMPLANT
MARKER SKIN DUAL TIP RULER LAB (MISCELLANEOUS) ×2 IMPLANT
PACK CYSTO (CUSTOM PROCEDURE TRAY) ×2 IMPLANT
SURGILUBE 2OZ TUBE FLIPTOP (MISCELLANEOUS) ×2 IMPLANT
SUT BONE WAX W31G (SUTURE) IMPLANT
SYR 10ML LL (SYRINGE) ×2 IMPLANT
TOWEL OR 17X26 10 PK STRL BLUE (TOWEL DISPOSABLE) ×2 IMPLANT
TUBE CONNECTING 12X1/4 (SUCTIONS) IMPLANT
UNDERPAD 30X36 HEAVY ABSORB (UNDERPADS AND DIAPERS) ×4 IMPLANT
WATER STERILE IRR 500ML POUR (IV SOLUTION) ×2 IMPLANT

## 2020-05-15 NOTE — Anesthesia Preprocedure Evaluation (Addendum)
Anesthesia Evaluation  Patient identified by MRN, date of birth, ID band Patient awake    Reviewed: Allergy & Precautions, NPO status , Patient's Chart, lab work & pertinent test results  Airway Mallampati: II  TM Distance: >3 FB Neck ROM: Full   Comment: Redundant neck tissue Dental no notable dental hx. (+)    Pulmonary sleep apnea and Continuous Positive Airway Pressure Ventilation , former smoker,    breath sounds clear to auscultation       Cardiovascular Exercise Tolerance: Good negative cardio ROS   Rhythm:Regular     Neuro/Psych negative neurological ROS  negative psych ROS   GI/Hepatic Neg liver ROS, GERD  ,  Endo/Other  diabetes, Type 2, Oral Hypoglycemic AgentsMorbid obesity  Renal/GU negative Renal ROS   Prostate cancer    Musculoskeletal  (+) Arthritis ,   Abdominal (+) + obese,   Peds negative pediatric ROS (+)  Hematology negative hematology ROS (+)   Anesthesia Other Findings   Reproductive/Obstetrics negative OB ROS                            Anesthesia Physical Anesthesia Plan  ASA: III  Anesthesia Plan: General   Post-op Pain Management:    Induction: Intravenous  PONV Risk Score and Plan: 2 and Ondansetron and Treatment may vary due to age or medical condition  Airway Management Planned: LMA  Additional Equipment: None  Intra-op Plan:   Post-operative Plan: Extubation in OR  Informed Consent: I have reviewed the patients History and Physical, chart, labs and discussed the procedure including the risks, benefits and alternatives for the proposed anesthesia with the patient or authorized representative who has indicated his/her understanding and acceptance.       Plan Discussed with: Anesthesiologist and CRNA  Anesthesia Plan Comments:        Anesthesia Quick Evaluation

## 2020-05-15 NOTE — Discharge Instructions (Signed)
Brachytherapy for Prostate Cancer, Care After °This sheet gives you information about how to care for yourself after your procedure. Your health care provider may also give you more specific instructions. If you have problems or questions, contact your health care provider. °What can I expect after the procedure? °After the procedure, it is common to have: °· Urinary symptoms. These may include: °? Trouble passing urine. °? Blood in the urine or semen. °? Frequent feeling of an urgent need to urinate. °· Constipation, nausea, or bloating and gas. °· Bruising, swelling, and tenderness of the area beneath the scrotum (perineum). °· Tiredness (fatigue). °· Burning or pain in the rectum. °· Problems getting or keeping an erection (erectile dysfunction). °Follow these instructions at home: °Eating and drinking °· Drink enough fluid to keep your urine pale yellow. °· Eat a healthy, balanced diet. This includes lean proteins, whole grains, and plenty of fruits and vegetables. °· If you drink alcohol: °? Limit how much you have to 0-2 drinks a day. °? Be aware of how much alcohol is in your drink. In the [place] one drink equals one 12 oz bottle of beer (355 mL), one 5 oz glass of wine (148 mL), or one 1½ oz glass of hard liquor (44 mL).   °Managing pain, stiffness, and swelling °· If directed, put ice on the affected area. To do this: °? Put ice in a plastic bag. °? Place a towel between your skin and the bag. °? Leave the ice on for 20 minutes, 2-3 times a day. °· Try not to sit directly on the perineum. A soft cushion can help with discomfort.   °Activity °· If you were given a sedative during the procedure, it can affect you for several hours. Do not drive or operate machinery until your health care provider says that it is safe. °· Do not lift anything that is heavier than 10 lb (4.5 kg), or the limit that you are told, until your health care provider says that it is safe. °· Rest as told by your health care  provider. °· Return to your normal activities as told by your health care provider. Most people can return to normal activities a few days or weeks after the procedure. Ask your health care provider what activities are safe for you.   °Treatment area care °Check your treatment area every day for signs of infection. Check for: °· Redness, swelling, or pain. °· Fluid or blood. °· Warmth. °· Pus or a bad smell. °Managing constipation °Your procedure may cause constipation. To prevent or treat constipation, you may need to: °· Take over-the-counter or prescription medicines. °· Eat foods that are high in fiber, such as beans, whole grains, and fresh fruits and vegetables. °· Limit foods that are high in fat and processed sugars, such as fried or sweet foods. °General instructions °· Take over-the-counter and prescription medicines only as told by your health care provider. °· Do not take baths, swim, or use a hot tub until your health care provider approves. Shower and wash the perineum gently. °· Do not have sex for one week after the treatment, or until your health care provider approves. °· Do not use any products that contain nicotine or tobacco, such as cigarettes, e-cigarettes, and chewing tobacco. If you need help quitting, ask your health care provider. °· If you have permanent, low-dose brachytherapy implants: °? Limit close contact with children and pregnant women for 2 months or as told by your health care provider. This   is important because of the radiation that is still active in the prostate. °? You may set off radioactive sensors, such as at airport screenings. Ask your health care provider for a document that explains your treatment. °? You may be told to use a condom during sex for the first 2 months after low-dose brachytherapy. °· Keep all follow-up visits as told by your health care provider. This is important. You may still need additional treatment. °Contact a health care provider if you: °· Have a  fever or chills. °· Have any of these signs of infection in the treatment area: °? Redness, swelling, or pain. °? Fluid or blood. °? Warmth. °? Pus or a bad smell. °· Have no bowel movements for 3-4 days after the procedure. °· Have diarrhea for 3-4 days after the procedure. °· Develop any new symptoms, such as problems with urinating or erectile dysfunction. °· Have pain in your abdomen. °· Have more blood in your urine. °· Have swelling or pain in your legs. °Get help right away if: °· You cannot urinate. °· You have a lot of bleeding from your rectum. °· You have unusual drainage coming from your rectum. °· You have severe pain in the treated area that does not go away with pain medicine. °· You have severe nausea or vomiting. °· You have difficulty breathing. °Summary °· Talk with your health care provider about your risk of brachytherapy side effects, such as erectile dysfunction or urinary problems. °· If you have permanent, low-dose brachytherapy implants, limit close contact with children and pregnant women for 2 months or as told by your health care provider. This is important because of the radiation that is still active in the prostate. °· You may be told to use a condom during sex for the first 2 months after low-dose brachytherapy. °· Keep all follow-up visits as told by your health care provider. This is important. You may need additional treatment. °This information is not intended to replace advice given to you by your health care provider. Make sure you discuss any questions you have with your health care provider. °Document Revised: 02/04/2019 Document Reviewed: 02/04/2019 °Elsevier Patient Education © 2021 [employer] ° ° ° °Post Anesthesia Home Care Instructions ° °Activity: °Get plenty of rest for the remainder of the day. A responsible individual must stay with you for 24 hours following the procedure.  °For the next 24 hours, DO NOT: °-Drive a car °-Operate machinery °-Drink alcoholic  beverages °-Take any medication unless instructed by your physician °-Make any legal decisions or sign important papers. ° °Meals: °Start with liquid foods such as gelatin or soup. Progress to regular foods as tolerated. Avoid greasy, spicy, heavy foods. If nausea and/or vomiting occur, drink only clear liquids until the nausea and/or vomiting subsides. Call your physician if vomiting continues. ° °Special Instructions/Symptoms: °Your throat may feel dry or sore from the anesthesia or the breathing tube placed in your throat during surgery. If this causes discomfort, gargle with warm salt water. The discomfort should disappear within 24 hours. ° ° °

## 2020-05-15 NOTE — Anesthesia Procedure Notes (Signed)
Procedure Name: Intubation Date/Time: 05/15/2020 9:29 AM Performed by: Lieutenant Diego, CRNA Pre-anesthesia Checklist: Patient identified, Emergency Drugs available, Suction available and Patient being monitored Patient Re-evaluated:Patient Re-evaluated prior to induction Oxygen Delivery Method: Circle system utilized Preoxygenation: Pre-oxygenation with 100% oxygen Induction Type: IV induction Ventilation: Mask ventilation without difficulty Laryngoscope Size: Miller, 2, Mac, 4 and Glidescope Grade View: Grade II Tube type: Oral Tube size: 7.5 mm Number of attempts: 1 Airway Equipment and Method: Stylet,  Oral airway and Video-laryngoscopy Placement Confirmation: ETT inserted through vocal cords under direct vision,  positive ETCO2 and breath sounds checked- equal and bilateral Secured at: 26 cm Tube secured with: Tape Dental Injury: Teeth and Oropharynx as per pre-operative assessment  Difficulty Due To: Difficult Airway- due to large tongue, Difficult Airway- due to anterior larynx and Difficulty was unanticipated Future Recommendations: Recommend- induction with short-acting agent, and alternative techniques readily available Comments: DL by [facility] unable to view cords. DL by CB unable to view cords. Glide scope with grade one view, ETT passes with ease.

## 2020-05-15 NOTE — H&P (Signed)
H&P  Chief Complaint: Prostate cancer  History of Present Illness: Dan Gomez is a 65-year-old male who has intermediate risk prostate cancer with a PSA of 5.7, normal exam, Gleason 4+3 and 3+4 disease.  His prostate measured 55 g.  He presents for primary brachytherapy and SpaceOAR gel placement.  Dr. Manning did not recommend neoadjuvant-adjuvant ADT due to the small volume 4+3.  Patient typically voids with a good stream although he has occasional frequency.  His AUA symptom score was around 11.  He is not on any medication for BPH or lower urinary tract symptoms.  Today, he is well. No dysuria, hematuria or fever.   Past Medical History:  Diagnosis Date  . Arthritis    oa  . CPAP (continuous positive airway pressure) dependence   . DM type 2 (diabetes mellitus, type 2) (HCC)   . GERD (gastroesophageal reflux disease)   . Prostate cancer (HCC)   . Sleep apnea   . Wears glasses    Past Surgical History:  Procedure Laterality Date  . APPENDECTOMY  as child  . NASAL SEPTUM SURGERY  age 19's   blood vessel cauterized  . nose cauterized for deviated septum  as child  . PROSTATE BIOPSY  2021  . TONSILLECTOMY  as child    Home Medications:  Medications Prior to Admission  Medication Sig Dispense Refill Last Dose  . famotidine (PEPCID) 20 MG tablet Take 20 mg by mouth as needed for heartburn or indigestion.   05/14/2020 at Unknown time  . JARDIANCE 25 MG TABS tablet Take 25 mg by mouth daily.   05/14/2020 at Unknown time  . lansoprazole (PREVACID) 30 MG capsule Take 30 mg by mouth daily at 12 noon.   05/15/2020 at Unknown time  . metFORMIN (GLUCOPHAGE) 500 MG tablet Take 500 mg by mouth daily with breakfast. Patient was unsure of the mg/Lisa   05/14/2020 at Unknown time   Allergies:  Allergies  Allergen Reactions  . Bactrim [Sulfamethoxazole-Trimethoprim]     Nausea vomiting    Family History  Problem Relation Age of Onset  . Multiple myeloma Father   . Colon cancer Maternal  Grandmother   . Stomach cancer Paternal Grandfather   . Prostate cancer Neg Hx   . Breast cancer Neg Hx   . Pancreatic cancer Neg Hx    Social History:  reports that he quit smoking about 34 years ago. His smoking use included cigarettes. He has a 18.00 pack-year smoking history. He has never used smokeless tobacco. He reports current alcohol use. He reports that he does not use drugs.  ROS: A complete review of systems was performed.  All systems are negative except for pertinent findings as noted. Review of Systems  All other systems reviewed and are negative.    Physical Exam:  Vital signs in last 24 hours: Temp:  [97 F (36.1 C)] 97 F (36.1 C) (01/28 0715) Pulse Rate:  [84] 84 (01/28 0715) Resp:  [18] 18 (01/28 0715) BP: (139)/(86) 139/86 (01/28 0715) SpO2:  [96 %] 96 % (01/28 0715) Weight:  [126.1 kg] 126.1 kg (01/28 0715) General:  Alert and oriented, No acute distress HEENT: Normocephalic, atraumatic Cardiovascular: Regular rate and rhythm Lungs: Regular rate and effort Abdomen: Soft, nontender, nondistended, no abdominal masses Back: No CVA tenderness Extremities: No edema Neurologic: Grossly intact  Laboratory Data:  No results found for this or any previous visit (from the past 24 hour(s)). Recent Results (from the past 240 hour(s))  SARS CORONAVIRUS 2 (TAT 6-24   HRS) Nasopharyngeal Nasopharyngeal Swab     Status: None   Collection Time: 05/13/20  9:54 AM   Specimen: Nasopharyngeal Swab  Result Value Ref Range Status   SARS Coronavirus 2 NEGATIVE NEGATIVE Final    Comment: (NOTE) SARS-CoV-2 target nucleic acids are NOT DETECTED.  The SARS-CoV-2 RNA is generally detectable in upper and lower respiratory specimens during the acute phase of infection. Negative results do not preclude SARS-CoV-2 infection, do not rule out co-infections with other pathogens, and should not be used as the sole basis for treatment or other patient management decisions. Negative  results must be combined with clinical observations, patient history, and epidemiological information. The expected result is Negative.  Fact Sheet for Patients: https://www.fda.gov/media/138098/download  Fact Sheet for Healthcare Providers: https://www.fda.gov/media/138095/download  This test is not yet approved or cleared by the [place] FDA and  has been authorized for detection and/or diagnosis of SARS-CoV-2 by FDA under an Emergency Use Authorization (EUA). This EUA will remain  in effect (meaning this test can be used) for the duration of the COVID-19 declaration under Se ction 564(b)(1) of the Act, 21 U.S.C. section 360bbb-3(b)(1), unless the authorization is terminated or revoked sooner.  Performed at [facility], [address] [place], [place] [address]    Creatinine: Recent Labs    05/13/20 0827  CREATININE 0.98    Impression/Assessment:  Prostate cancer-  Plan:  I discussed with the patient the nature, potential benefits, risks and alternatives to transperineal placement of prostate brachytherapy seeds and SpaceOAR biodegradable gel, including side effects of the proposed treatment, the likelihood of the patient achieving the goals of the procedure, and any potential problems that might occur during the procedure or recuperation. All questions answered. Patient elects to proceed.    Dan Gomez 05/15/2020, 7:22 AM   

## 2020-05-15 NOTE — Op Note (Signed)
Preoperative diagnosis: Stage I ([id number]) Prostate cancer Postoperative diagnosis: Same  Procedure: Prostate brachytherapy seed implant, SpaceOar biodegradable gel, Cystoscopy  Surgeon: Junious Silk  Radiation oncologist: Tammi Klippel  Anesthesia: Gen.  Indication for procedure: 65 year old with stage I prostate cancer who elected to proceed with prostate brachytherapy.  Findings: On fluoroscopic imaging there was adequate coverage of the prostate. On cystoscopy the urethra appeared normal, the prostatic urethra appeared normal, the trigone and ureteral orifices appeared normal with clear efflux. The bladder mucosa appeared normal. There was a strand (thought to be H4) at the left prostatic urethra removed in two pieces and three seeds (seed total from 72 down to 69). On computer sim, removal of H4 did not affect total dose or coverage per Dr. Tammi Klippel on prelim analysis.   Dose:145 Gy  Description of procedure: After consent was obtained patient brought to the operating room. After adequate anesthesia he is placed in lithotomy position and the transrectal ultrasound probe and perineal template positioned. Catheters and brachytherapy seeds were placed per Dr. Johny Shears plan. A total of 20 catheters and 69 active sources (I-125) were placed. The anchoring needles, template and ultrasound were removed. Scout flouro imaging was obtained of the implant. The Foley was removed. Another image obtained.   The 18-gauge needle was then inserted approximately 1 to 2 cm anterior to the anal opening and directed under fluoroscopic guidance into the perirectal fat between the anterior rectal wall and the prostate capsule down to the mid-gland. Midline needle position was confirmed in the sagittal and axial views to verify the tip was in the perirectal fat.  Small amounts of saline were injected to hydrodissect the space between the prostate and the anterior rectal wall.  Axial imaging was viewed to confirm the  needle was in the correct location in the mid gland and centered.  Aspiration confirmed no intravascular access.  The saline syringe was carefully disconnected maintaining the desired needle position and the hydrogel was attached to the needle.  Under ultrasound guidance in the sagittal view a smooth continuous injection was done over about 12 seconds delivering the hydrogel into the space between the prostate and rectal wall.  The needle was withdrawn.   The patient was prepped again and cystoscopy was performed.  In the prostatic urethra to the left to the veru a break he strand was seen emanating into the prostatic urethra and then going back in the prostatic tissue toward the base.  It was grasped with a flexible grasper to remove it and on the way out dropped back in the urethra.  On visualization back in the urethra the piece was washed up into the bladder.  The strand was visualized on the left and noted to be broken into 2 pieces.  The bladder began to fill and visualization was difficult and therefore the flexible scope was removed.  The rigid cystoscope was passed per urethra into the bladder and because the bladder was full visualization wasn't good.  The scope was used to drain the bladder into a basin and then the water was filtered through a paper towel.  The 1st half of the strand was found in the bottom of the basin.  The rigid cystoscope was passed per urethra again and back in the prostatic urethra the other half of the strand was noted to be emanating from the prostatic tissue into the prostatic urethra.  It was now easier to grasp with the rigid cystoscope.  It was pulled up into the bladder and then pulled out  of the urethra.  It was also inspected by the radiation oncology staff and noted to be the other half of the strand.  This drain was removed in 2 pieces with a total of 3 seeds.  Reinspection of the urethra and the bladder noted there to be no other foreign bodies or injury.  Hemostasis  was good therefore I decided not to leave a Foley catheter.  The bladder was drained and the scope removed. He was awakened taken to the recovery room in stable condition.  Complications: None  Blood loss: Minimal  Specimens: None  Drains: none   Disposition: Patient stable to PACU.

## 2020-05-15 NOTE — Transfer of Care (Signed)
Immediate Anesthesia Transfer of Care Note  Patient: Dan Gomez  Procedure(s) Performed: RADIOACTIVE SEED IMPLANT/BRACHYTHERAPY IMPLANT (N/A Rectum) SPACE OAR INSTILLATION (N/A Rectum)  Patient Location: PACU  Anesthesia Type:General  Level of Consciousness: awake  Airway & Oxygen Therapy: Patient Spontanous Breathing and Patient connected to face mask oxygen  Post-op Assessment: Report given to RN and Post -op Vital signs reviewed and stable  Post vital signs: Reviewed and stable  Last Vitals:  Vitals Value Taken Time  BP 180/102 05/15/20 1200  Temp 36.7 C 05/15/20 1200  Pulse 83 05/15/20 1201  Resp 12 05/15/20 1201  SpO2 93 % 05/15/20 1201  Vitals shown include unvalidated device data.  Last Pain:  Vitals:   05/15/20 1115  TempSrc:   PainSc: 0-No pain      Patients Stated Pain Goal: 5 (82/95/62 1308)  Complications: No complications documented.

## 2020-05-15 NOTE — Anesthesia Postprocedure Evaluation (Signed)
Anesthesia Post Note  Patient: Geographical information systems officer  Procedure(s) Performed: RADIOACTIVE SEED IMPLANT/BRACHYTHERAPY IMPLANT (N/A Rectum) SPACE OAR INSTILLATION (N/A Rectum)     Patient location during evaluation: PACU Anesthesia Type: General Level of consciousness: awake Pain management: pain level controlled Vital Signs Assessment: post-procedure vital signs reviewed and stable Respiratory status: spontaneous breathing and respiratory function stable Cardiovascular status: stable Postop Assessment: no apparent nausea or vomiting Anesthetic complications: no   No complications documented.  Last Vitals:  Vitals:   05/15/20 1130 05/15/20 1140  BP: (!) 166/102   Pulse: 86 92  Resp: 15 17  Temp:    SpO2: 97% 96%    Last Pain:  Vitals:   05/15/20 1115  TempSrc:   PainSc: 0-No pain                 Merlinda Frederick

## 2020-05-18 ENCOUNTER — Encounter (HOSPITAL_BASED_OUTPATIENT_CLINIC_OR_DEPARTMENT_OTHER): Payer: Self-pay | Admitting: Urology

## 2020-05-29 ENCOUNTER — Telehealth: Payer: Self-pay | Admitting: *Deleted

## 2020-05-29 DIAGNOSIS — C61 Malignant neoplasm of prostate: Secondary | ICD-10-CM | POA: Diagnosis not present

## 2020-05-29 NOTE — Telephone Encounter (Signed)
RETURNED PATIENT'S PHONE CALL, SPOKE WITH PATIENT. ?

## 2020-06-04 DIAGNOSIS — K219 Gastro-esophageal reflux disease without esophagitis: Secondary | ICD-10-CM | POA: Diagnosis not present

## 2020-06-04 DIAGNOSIS — G4733 Obstructive sleep apnea (adult) (pediatric): Secondary | ICD-10-CM | POA: Diagnosis not present

## 2020-06-04 DIAGNOSIS — E119 Type 2 diabetes mellitus without complications: Secondary | ICD-10-CM | POA: Diagnosis not present

## 2020-06-04 DIAGNOSIS — E7800 Pure hypercholesterolemia, unspecified: Secondary | ICD-10-CM | POA: Diagnosis not present

## 2020-06-10 ENCOUNTER — Telehealth: Payer: Self-pay | Admitting: *Deleted

## 2020-06-10 NOTE — Telephone Encounter (Signed)
CALLED PATIENT TO REMIND OF POST SEED APPTS. FOR 06-11-20, LVM FOR A RETURN CALL

## 2020-06-10 NOTE — Progress Notes (Addendum)
Patient is here today for a follow-up post seed appointment. Patient reports: Patient states that he fells pressure in his groin area . Dysuria  None Hematuria None Nocturia x5 Leakage  Some Urgency Yes Emptying bladder States that he does not empty his bladder  Stream  Moderate to weak Push or strain to start stream No Bowels No issues Next appointment with urologist States that his appointment is in May . Seen the  NP for the urologist last Friday. IPPS 31 Vitals:   06/11/20 0825  BP: (!) 148/83  Pulse: 81  Resp: 20  Temp: 97.8 F (36.6 C)  SpO2: 96%  Weight: 125.7 kg  Height: 5\' 10"  (1.778 m)

## 2020-06-11 ENCOUNTER — Ambulatory Visit
Admission: RE | Admit: 2020-06-11 | Discharge: 2020-06-11 | Disposition: A | Discharge status: Home / Self Care | Payer: BC Managed Care – PPO | Source: Ambulatory Visit | Attending: Urology | Admitting: Urology

## 2020-06-11 ENCOUNTER — Ambulatory Visit
Admission: RE | Admit: 2020-06-11 | Discharge: 2020-06-11 | Disposition: A | Discharge status: Home / Self Care | Payer: BC Managed Care – PPO | Source: Ambulatory Visit | Attending: Radiation Oncology | Admitting: Radiation Oncology

## 2020-06-11 ENCOUNTER — Encounter: Payer: Self-pay | Admitting: Urology

## 2020-06-11 ENCOUNTER — Encounter: Payer: Self-pay | Admitting: Medical Oncology

## 2020-06-11 ENCOUNTER — Other Ambulatory Visit: Payer: Self-pay

## 2020-06-11 VITALS — BP 148/83 | HR 81 | Temp 97.8°F | Resp 20 | Ht 70.0 in | Wt 277.2 lb

## 2020-06-11 DIAGNOSIS — R351 Nocturia: Secondary | ICD-10-CM | POA: Insufficient documentation

## 2020-06-11 DIAGNOSIS — Z923 Personal history of irradiation: Secondary | ICD-10-CM | POA: Insufficient documentation

## 2020-06-11 DIAGNOSIS — R350 Frequency of micturition: Secondary | ICD-10-CM | POA: Diagnosis not present

## 2020-06-11 DIAGNOSIS — R3912 Poor urinary stream: Secondary | ICD-10-CM | POA: Diagnosis not present

## 2020-06-11 DIAGNOSIS — C61 Malignant neoplasm of prostate: Secondary | ICD-10-CM

## 2020-06-11 DIAGNOSIS — R3911 Hesitancy of micturition: Secondary | ICD-10-CM | POA: Diagnosis not present

## 2020-06-11 DIAGNOSIS — Z79899 Other long term (current) drug therapy: Secondary | ICD-10-CM | POA: Insufficient documentation

## 2020-06-11 NOTE — Addendum Note (Signed)
Encounter addended by: Sherrlyn Hock, RN on: 06/11/2020 9:41 AM  Actions taken: Flowsheet accepted, Charge Capture section accepted

## 2020-06-11 NOTE — Progress Notes (Signed)
  Radiation Oncology         [phone]) [phone] ________________________________  Name: Dan Gomez MRN: [id number]  Date: 06/11/2020  DOB: 05-Nov-1955  COMPLEX SIMULATION NOTE  NARRATIVE:  The patient was brought to the [facility] today following prostate seed implantation approximately one month ago.  Identity was confirmed.  All relevant records and images related to the planned course of therapy were reviewed.  Then, the patient was set-up supine.  CT images were obtained.  The CT images were loaded into the planning software.  Then the prostate and rectum were contoured.  Treatment planning then occurred.  The implanted iodine 125 seeds were identified by the physics staff for projection of radiation distribution  I have requested : 3D Simulation  I have requested a DVH of the following structures: Prostate and rectum.    ________________________________  Sheral Apley Tammi Klippel, M.D.

## 2020-06-11 NOTE — Progress Notes (Signed)
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 Malignant neoplasm of prostate ([place])  C61     Interval Since Last Radiation:  4 weeks 05/15/20:  Insertion of radioactive I-125 seeds into the prostate gland; 145 Gy, definitive therapy with placement of SpaceOAR gel.  Narrative:  The patient returns today for routine follow-up.  He is complaining of increased urinary frequency and urinary hesitation symptoms. He filled out a questionnaire regarding urinary function today providing and overall IPSS score of 31 characterizing his symptoms as severe but gradually improving.  His pre-implant score was 12.  He had a follow-up visit with Jiles Crocker, NP at the urology office on 05/29/2020 and was started on Uroxatrol at that time which has been beneficial.  He can noticed significant improvement in the flow of stream and ability to empty his bladder but feels like this wears off by evening.  He continues with nocturia x5 as well as hesitancy weak stream, intermittency, increased frequency, urgency and feelings of incomplete emptying.  He denies dysuria or gross hematuria.  He has not had any recent fevers, chills or night sweats.  He denies any abdominal pain or bowel symptoms.  He reports a healthy appetite and is maintaining his weight.  He denies any significant fatigue and overall, is pleased with his progress to date.  ALLERGIES:  is allergic to bactrim [sulfamethoxazole-trimethoprim].  Meds: Current Outpatient Medications  Medication Sig Dispense Refill  . alfuzosin (UROXATRAL) 10 MG 24 hr tablet Take 1 tablet (10 mg total) by mouth daily with breakfast. 7 tablet 0  .  famotidine (PEPCID) 20 MG tablet Take 20 mg by mouth as needed for heartburn or indigestion.    Marland Kitchen JARDIANCE 25 MG TABS tablet Take 25 mg by mouth daily.    . lansoprazole (PREVACID) 30 MG capsule Take 30 mg by mouth daily at 12 noon.    . metFORMIN (GLUCOPHAGE) 500 MG tablet Take 500 mg by mouth daily with breakfast. Patient was unsure of the mg/Lisa    . cephALEXin (KEFLEX) 500 MG capsule Take 1 capsule (500 mg total) by mouth 2 (two) times daily. (Patient not taking: Reported on 06/11/2020) 6 capsule 0  . HYDROcodone-acetaminophen (NORCO) 7.5-325 MG tablet Take 1 tablet by mouth every 6 (six) hours as needed for moderate pain. (Patient not taking: Reported on 06/11/2020) 10 tablet 0   No current facility-administered medications for this encounter.    Physical Findings: In general this is a well appearing Caucasian male in no acute distress. He's alert and oriented x4 and appropriate throughout the examination. Cardiopulmonary assessment is negative for acute distress and he exhibits normal effort.   Lab Findings: Lab Results  Component Value Date   WBC 8.1 05/13/2020   HGB 17.4 (H) 05/13/2020   HCT 51.8 05/13/2020   MCV 88.5 05/13/2020   PLT 267 05/13/2020    Radiographic Findings:  Patient underwent CT imaging in our clinic for post implant dosimetry. The CT will be reviewed by Dr. Tammi Klippel to confirm there is an adequate distribution of radioactive seeds throughout the prostate gland and ensure that there are no seeds in or near the rectum. We suspect the  final radiation plan and dosimetry will show appropriate coverage of the prostate gland. He understands that we will call and inform him of any unexpected findings on further review of his imaging and dosimetry.  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 His urinary symptoms should gradually improve  over the next 4-6 months. We talked about this today. He is encouraged by his improvement already and is otherwise pleased with his outcome. We also talked about long-term follow-up for prostate cancer following seed implant. He understands that ongoing PSA determinations and digital rectal exams will help perform surveillance to rule out disease recurrence. He has a follow up appointment for labs on 09/02/2020 and will see Dr. Junious Silk the following week. He understands what to expect with his PSA measures. Patient was also educated today about some of the long-term effects from radiation including a small risk for rectal bleeding and possibly erectile dysfunction. We talked about some of the general management approaches to these potential complications. However, I did encourage the patient to contact our office or return at any point if he has questions or concerns related to his previous radiation and prostate cancer.    Nicholos Johns, PA-C

## 2020-06-12 ENCOUNTER — Encounter (HOSPITAL_BASED_OUTPATIENT_CLINIC_OR_DEPARTMENT_OTHER): Payer: Self-pay | Admitting: Urology

## 2020-06-16 ENCOUNTER — Encounter: Payer: Self-pay | Admitting: Radiation Oncology

## 2020-06-16 ENCOUNTER — Ambulatory Visit
Admission: RE | Admit: 2020-06-16 | Discharge: 2020-06-16 | Disposition: A | Discharge status: Home / Self Care | Payer: BC Managed Care – PPO | Source: Ambulatory Visit | Attending: Radiation Oncology | Admitting: Radiation Oncology

## 2020-06-16 DIAGNOSIS — C61 Malignant neoplasm of prostate: Secondary | ICD-10-CM | POA: Insufficient documentation

## 2020-06-23 NOTE — Progress Notes (Signed)
°  Radiation Oncology         [phone]) [phone] ________________________________  Name: Dan Gomez MRN: [id number]  Date: 06/16/2020  DOB: 20-Jun-1955  3D Planning Note   Prostate Brachytherapy Post-Implant Dosimetry  Diagnosis:  65 y.o. gentleman with Stage T1c adenocarcinoma of the prostate with Gleason score of 4+3, and PSA of 5.61.  Narrative: On a previous date, Dan Gomez returned following prostate seed implantation for post implant planning. He underwent CT scan complex simulation to delineate the three-dimensional structures of the pelvis and demonstrate the radiation distribution.  Since that time, the seed localization, and complex isodose planning with dose volume histograms have now been completed.  Results:   Prostate Coverage - The dose of radiation delivered to the 90% or more of the prostate gland (D90) was 115.65% of the prescription dose. This exceeds our goal of greater than 90%. Rectal Sparing - The volume of rectal tissue receiving the prescription dose or higher was 0.01 cc. This falls under our thresholds tolerance of 1.0 cc.  Impression: The prostate seed implant appears to show adequate target coverage and appropriate rectal sparing.  Plan:  The patient will continue to follow with urology for ongoing PSA determinations. I would anticipate a high likelihood for local tumor control with minimal risk for rectal morbidity.  ________________________________  Sheral Apley Tammi Klippel, M.D.

## 2020-09-02 DIAGNOSIS — C61 Malignant neoplasm of prostate: Secondary | ICD-10-CM | POA: Diagnosis not present

## 2020-09-07 DIAGNOSIS — R350 Frequency of micturition: Secondary | ICD-10-CM | POA: Diagnosis not present

## 2020-09-07 DIAGNOSIS — N401 Enlarged prostate with lower urinary tract symptoms: Secondary | ICD-10-CM | POA: Diagnosis not present

## 2020-09-07 DIAGNOSIS — C61 Malignant neoplasm of prostate: Secondary | ICD-10-CM | POA: Diagnosis not present

## 2020-10-20 ENCOUNTER — Emergency Department (HOSPITAL_COMMUNITY)
Admission: EM | Admit: 2020-10-20 | Discharge: 2020-10-20 | Disposition: A | Discharge status: Home / Self Care | Payer: BC Managed Care – PPO | Attending: Emergency Medicine | Admitting: Emergency Medicine

## 2020-10-20 ENCOUNTER — Encounter (HOSPITAL_COMMUNITY): Payer: Self-pay | Admitting: *Deleted

## 2020-10-20 DIAGNOSIS — Z87891 Personal history of nicotine dependence: Secondary | ICD-10-CM | POA: Diagnosis not present

## 2020-10-20 DIAGNOSIS — R220 Localized swelling, mass and lump, head: Secondary | ICD-10-CM | POA: Diagnosis not present

## 2020-10-20 DIAGNOSIS — E119 Type 2 diabetes mellitus without complications: Secondary | ICD-10-CM | POA: Diagnosis not present

## 2020-10-20 DIAGNOSIS — I10 Essential (primary) hypertension: Secondary | ICD-10-CM | POA: Diagnosis not present

## 2020-10-20 DIAGNOSIS — Z8546 Personal history of malignant neoplasm of prostate: Secondary | ICD-10-CM | POA: Insufficient documentation

## 2020-10-20 DIAGNOSIS — Z7984 Long term (current) use of oral hypoglycemic drugs: Secondary | ICD-10-CM | POA: Diagnosis not present

## 2020-10-20 DIAGNOSIS — K1120 Sialoadenitis, unspecified: Secondary | ICD-10-CM | POA: Diagnosis not present

## 2020-10-20 MED ORDER — AMOXICILLIN-POT CLAVULANATE 875-125 MG PO TABS: 1.0000 | ORAL_TABLET | Freq: Two times a day (BID) | ORAL | 0 refills | Status: AC

## 2020-10-20 NOTE — ED Provider Notes (Signed)
[facility] DEPT Provider Note   CSN: [id number] Arrival date & time: 10/20/20  0818     History Chief Complaint  Patient presents with   Facial Swelling    Dan Gomez is a 65 y.o. male.  HPI  Patient presented for evaluation of swelling around the right side of his face jaw that started this morning.  Patient noticed it after eating a granola bar this morning.  He started developing some swelling around the right side of his jaw.  He is not having any dental pain.  He is not having a sore throat.  No difficulty breathing or speaking.  It is tender to the touch.  Initially was think about seeing his primary doctor but the symptoms progressed somewhat rapidly and he was concerned.  no history of similar symptoms in the past.  No rashes noted elsewhere  Past Medical History:  Diagnosis Date   Arthritis    oa   CPAP (continuous positive airway pressure) dependence    DM type 2 (diabetes mellitus, type 2) ([facility])    GERD (gastroesophageal reflux disease)    Prostate cancer ([place])    Sleep apnea    Wears glasses     Patient Active Problem List   Diagnosis Date Noted   Malignant neoplasm of prostate ([place]) 01/21/2020    Past Surgical History:  Procedure Laterality Date   APPENDECTOMY  as child   NASAL SEPTUM SURGERY  age 77's   blood vessel cauterized   nose cauterized for deviated septum  as child   PROSTATE BIOPSY  2021   RADIOACTIVE SEED IMPLANT N/A 05/15/2020   Procedure: RADIOACTIVE SEED IMPLANT/BRACHYTHERAPY IMPLANT;  Surgeon: Festus Aloe, MD;  Location: [facility];  Service: Urology;  Laterality: N/A;   SPACE OAR INSTILLATION N/A 05/15/2020   Procedure: SPACE OAR INSTILLATION;  Surgeon: Festus Aloe, MD;  Location: [facility];  Service: Urology;  Laterality: N/A;   TONSILLECTOMY  as child       Family History  Problem Relation Age of Onset   Multiple myeloma Father    Colon cancer Maternal  Grandmother    Stomach cancer Paternal Grandfather    Prostate cancer Neg Hx    Breast cancer Neg Hx    Pancreatic cancer Neg Hx     Social History   Tobacco Use   Smoking status: Former    Packs/day: 1.50    Years: 12.00    Pack years: 18.00    Types: Cigarettes    Quit date: 04/18/1986    Years since quitting: 34.5   Smokeless tobacco: Never  Vaping Use   Vaping Use: Never used  Substance Use Topics   Alcohol use: Yes    Alcohol/week: 0.0 standard drinks    Comment: occ   Drug use: No    Home Medications Prior to Admission medications   Medication Sig Start Date End Date Taking? Authorizing Provider  amoxicillin-clavulanate (AUGMENTIN) 875-125 MG tablet Take 1 tablet by mouth 2 (two) times daily. 10/20/20  Yes Dorie Rank, MD  alfuzosin (UROXATRAL) 10 MG 24 hr tablet Take 1 tablet (10 mg total) by mouth daily with breakfast. 05/15/20   Festus Aloe, MD  famotidine (PEPCID) 20 MG tablet Take 20 mg by mouth as needed for heartburn or indigestion.    [provider]  JARDIANCE 25 MG TABS tablet Take 25 mg by mouth daily. 12/06/19   [provider]  lansoprazole (PREVACID) 30 MG capsule Take 30 mg by  mouth daily at 12 noon.    [provider]  metFORMIN (GLUCOPHAGE) 500 MG tablet Take 500 mg by mouth daily with breakfast. Patient was unsure of the mg/Lisa    [provider]    Allergies    Bactrim [sulfamethoxazole-trimethoprim]  Review of Systems   Review of Systems  All other systems reviewed and are negative.  Physical Exam Updated Vital Signs BP (!) 172/104   Pulse 84   Temp 97.9 F (36.6 C) (Oral)   Resp 18   SpO2 94%   Physical Exam Vitals and nursing note reviewed.  Constitutional:      General: He is not in acute distress.    Appearance: He is well-developed.  HENT:     Head: Normocephalic and atraumatic.     Comments: Edema and slight tenderness to the right submandibular parotid region, left side unaffected     Right Ear: External ear normal.     Left Ear: External ear normal.     Mouth/Throat:     Pharynx: No oropharyngeal exudate or posterior oropharyngeal erythema.  Eyes:     General: No scleral icterus.       Right eye: No discharge.        Left eye: No discharge.     Conjunctiva/sclera: Conjunctivae normal.  Neck:     Trachea: No tracheal deviation.  Cardiovascular:     Rate and Rhythm: Normal rate.  Pulmonary:     Effort: Pulmonary effort is normal. No respiratory distress.     Breath sounds: No stridor.  Abdominal:     General: There is no distension.  Musculoskeletal:        General: No swelling or deformity.     Cervical back: Neck supple.  Skin:    General: Skin is warm and dry.     Findings: No rash.  Neurological:     Mental Status: He is alert.     Cranial Nerves: Cranial nerve deficit: no gross deficits.    ED Results / Procedures / Treatments   Labs (all labs ordered are listed, but only abnormal results are displayed) Labs Reviewed - No data to display  EKG None  Radiology No results found.  Procedures Procedures   Medications Ordered in ED Medications - No data to display  ED Course  I have reviewed the triage vital signs and the nursing notes.  Pertinent labs & imaging results that were available during my care of the patient were reviewed by me and considered in my medical decision making (see chart for details).    MDM Rules/Calculators/A&P                          Symptoms are suggestive of sialoadenitis.  May be involving the parotid gland versus the submandibular gland.  We will start patient on a course of antibiotic.  Recommended sour candies to promote salivation.  Discussed outpatient follow-up with ENT or PCP.  Patient also noted to be hypertensive.  Asymptomatic.  Recommend follow-up with primary care doctor Final Clinical Impression(s) / ED Diagnoses Final diagnoses:  Sialadenitis  Hypertension, unspecified type    Rx / DC  Orders ED Discharge Orders          Ordered    amoxicillin-clavulanate (AUGMENTIN) 875-125 MG tablet  2 times daily        10/20/20 0904             Dorie Rank, MD 10/20/20 [phone]

## 2020-10-20 NOTE — ED Triage Notes (Signed)
Pt complains of right sided jaw swelling that he noticed after eating breakfast this morning. He reports having chocolate chip granola bar.

## 2020-10-20 NOTE — Discharge Instructions (Addendum)
Take antibiotics as prescribed.  Also try eating sour foods such as sucking on lemon candies to promote salivation.  Over the counter medications as needed for pain.Follow up with your primary care doctor or ENT doctor in a week to be rechecked if symptoms have not improved.  Your blood pressure was also elevated while you were in the ED today.  Follow up with your doctor to have that rechecked

## 2020-10-21 DIAGNOSIS — K1121 Acute sialoadenitis: Secondary | ICD-10-CM | POA: Diagnosis not present

## 2020-10-26 DIAGNOSIS — K1120 Sialoadenitis, unspecified: Secondary | ICD-10-CM | POA: Diagnosis not present

## 2020-12-16 DIAGNOSIS — N401 Enlarged prostate with lower urinary tract symptoms: Secondary | ICD-10-CM | POA: Diagnosis not present

## 2020-12-16 DIAGNOSIS — C61 Malignant neoplasm of prostate: Secondary | ICD-10-CM | POA: Diagnosis not present

## 2020-12-16 DIAGNOSIS — R350 Frequency of micturition: Secondary | ICD-10-CM | POA: Diagnosis not present

## 2020-12-17 DIAGNOSIS — E1169 Type 2 diabetes mellitus with other specified complication: Secondary | ICD-10-CM | POA: Diagnosis not present

## 2020-12-17 DIAGNOSIS — Z23 Encounter for immunization: Secondary | ICD-10-CM | POA: Diagnosis not present

## 2020-12-17 DIAGNOSIS — C61 Malignant neoplasm of prostate: Secondary | ICD-10-CM | POA: Diagnosis not present

## 2020-12-17 DIAGNOSIS — E7800 Pure hypercholesterolemia, unspecified: Secondary | ICD-10-CM | POA: Diagnosis not present

## 2020-12-17 DIAGNOSIS — K219 Gastro-esophageal reflux disease without esophagitis: Secondary | ICD-10-CM | POA: Diagnosis not present

## 2021-03-23 DIAGNOSIS — C61 Malignant neoplasm of prostate: Secondary | ICD-10-CM | POA: Diagnosis not present

## 2021-05-17 DIAGNOSIS — D124 Benign neoplasm of descending colon: Secondary | ICD-10-CM | POA: Diagnosis not present

## 2021-05-17 DIAGNOSIS — K648 Other hemorrhoids: Secondary | ICD-10-CM | POA: Diagnosis not present

## 2021-05-17 DIAGNOSIS — Z1211 Encounter for screening for malignant neoplasm of colon: Secondary | ICD-10-CM | POA: Diagnosis not present

## 2021-05-17 DIAGNOSIS — D120 Benign neoplasm of cecum: Secondary | ICD-10-CM | POA: Diagnosis not present

## 2021-06-16 DIAGNOSIS — C61 Malignant neoplasm of prostate: Secondary | ICD-10-CM | POA: Diagnosis not present

## 2021-06-23 DIAGNOSIS — R350 Frequency of micturition: Secondary | ICD-10-CM | POA: Diagnosis not present

## 2021-06-23 DIAGNOSIS — C61 Malignant neoplasm of prostate: Secondary | ICD-10-CM | POA: Diagnosis not present

## 2021-06-24 DIAGNOSIS — K219 Gastro-esophageal reflux disease without esophagitis: Secondary | ICD-10-CM | POA: Diagnosis not present

## 2021-06-24 DIAGNOSIS — C61 Malignant neoplasm of prostate: Secondary | ICD-10-CM | POA: Diagnosis not present

## 2021-06-24 DIAGNOSIS — E1169 Type 2 diabetes mellitus with other specified complication: Secondary | ICD-10-CM | POA: Diagnosis not present

## 2021-06-24 DIAGNOSIS — E7800 Pure hypercholesterolemia, unspecified: Secondary | ICD-10-CM | POA: Diagnosis not present

## 2021-08-02 IMAGING — DX DG CHEST 2V
2 series · 2 of 2 positions shown · non-contrast
Comparison: 03/31/2011

CLINICAL DATA: Preoperative evaluation

EXAM:
CHEST - 2 VIEW

[chest pa]
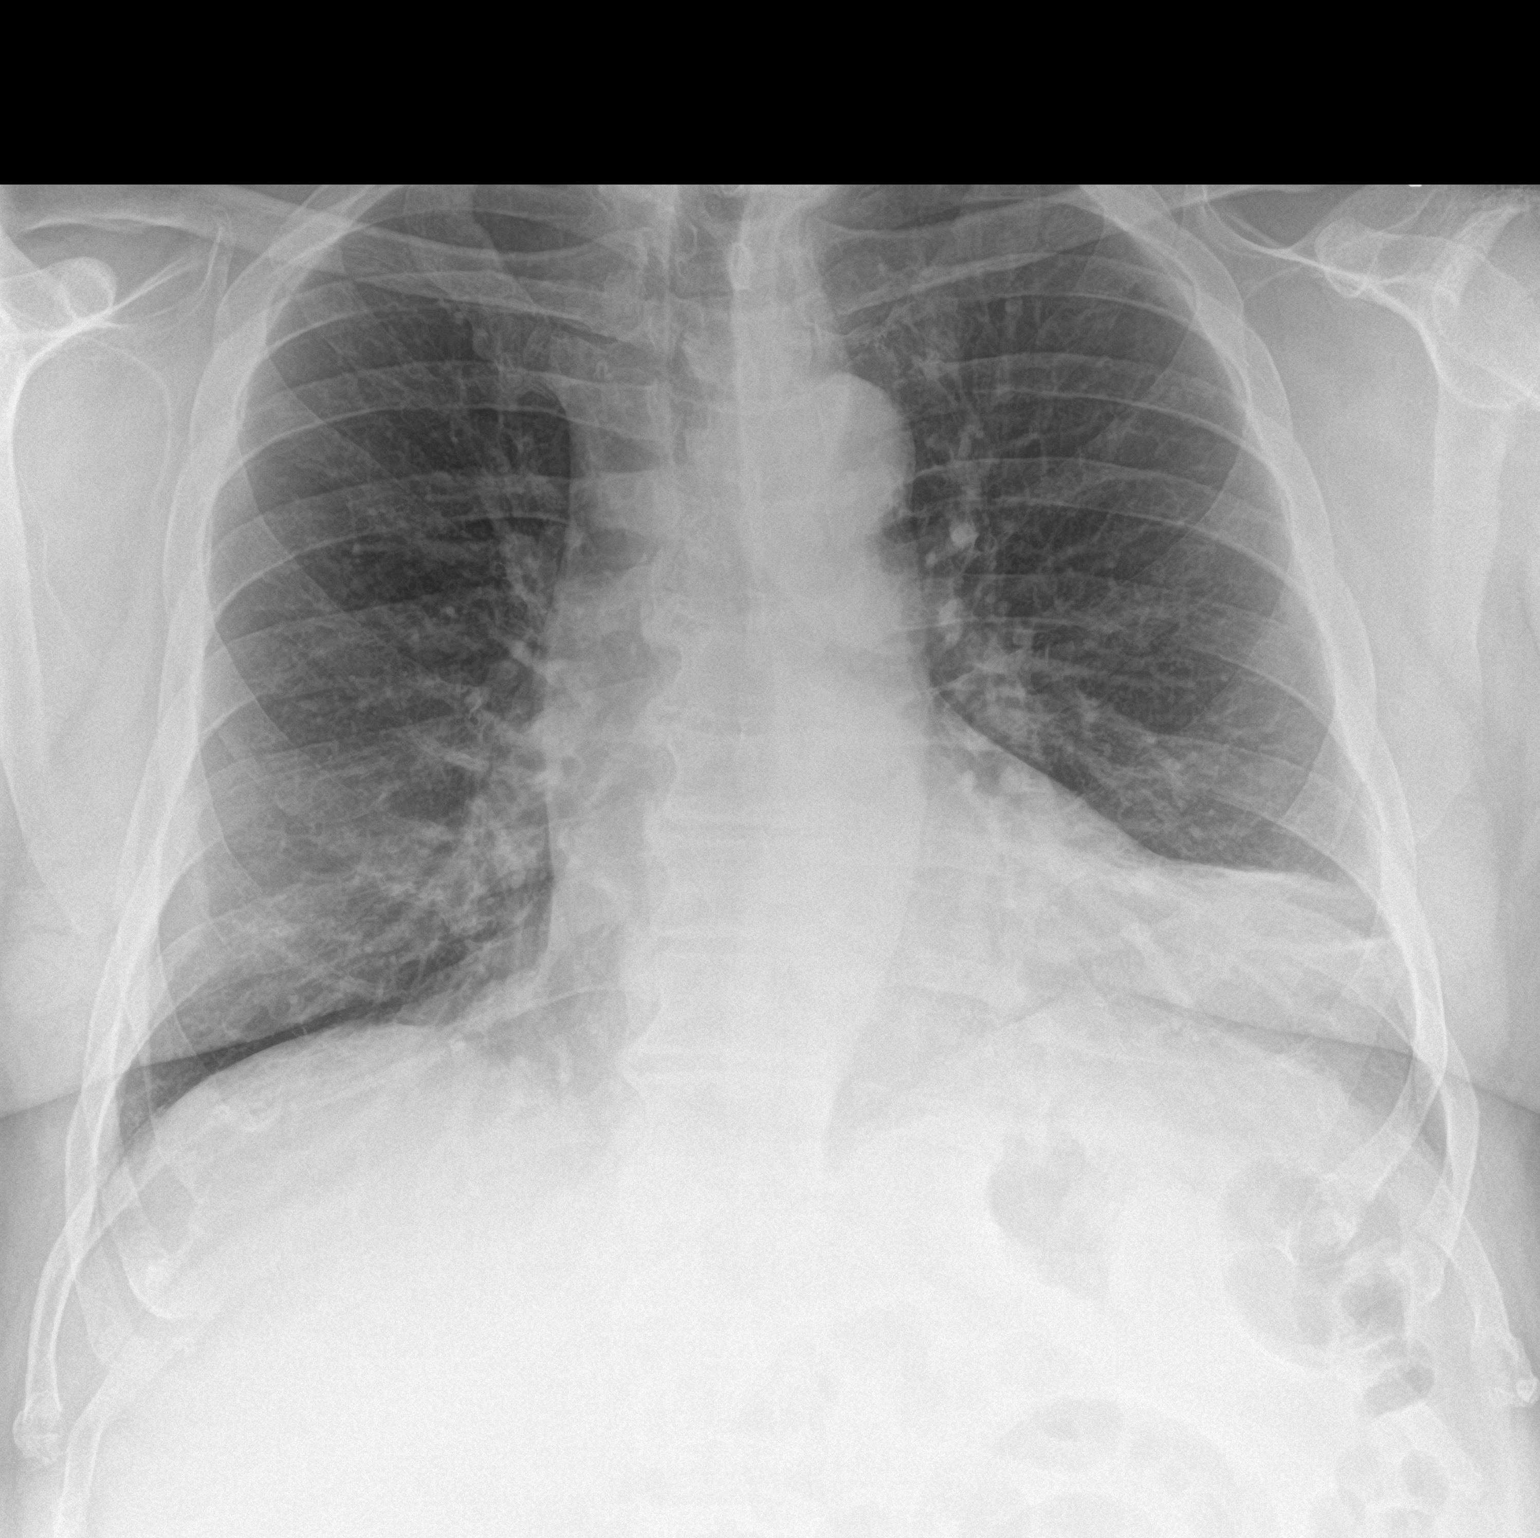

[chest lat]
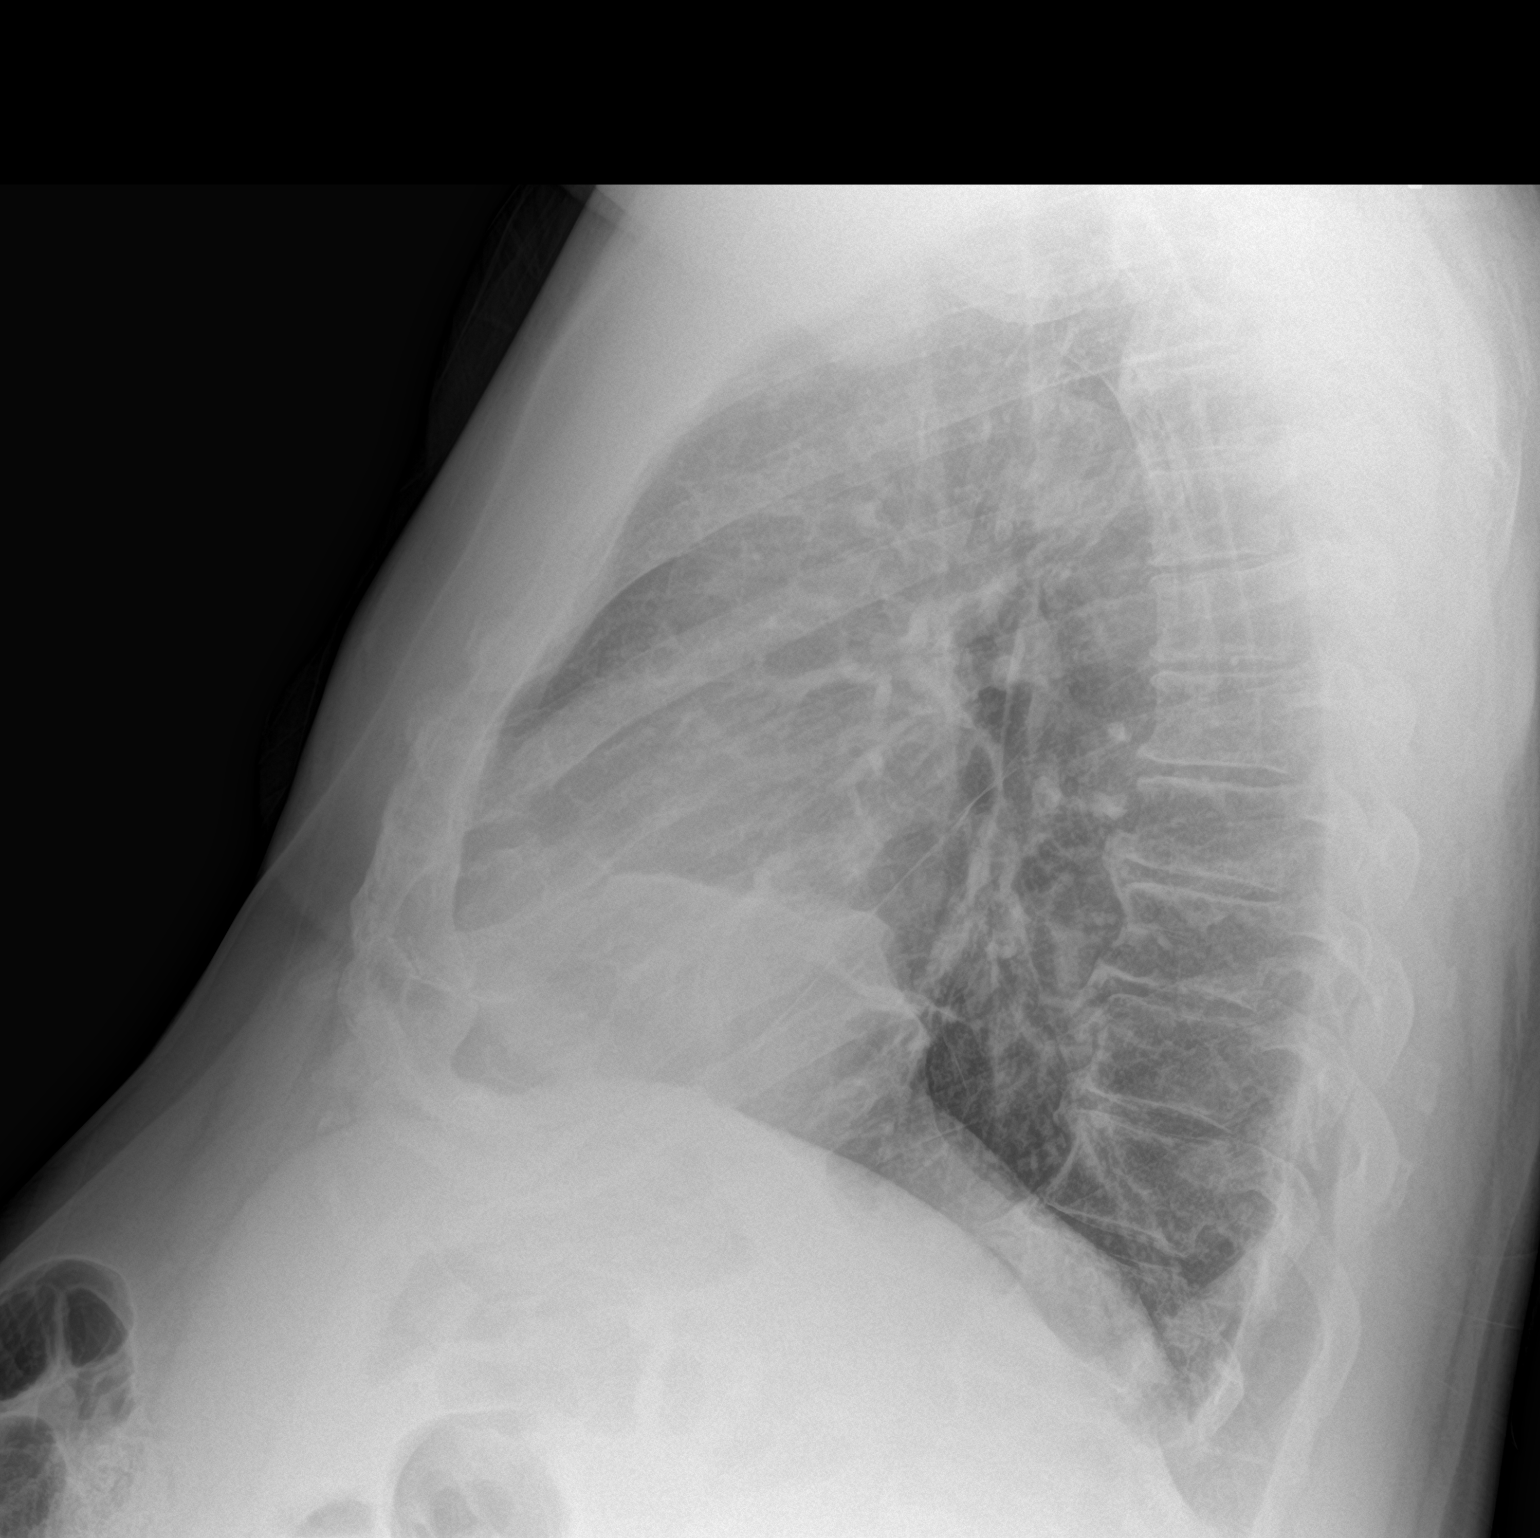

[2 of 2 positions shown; findings below may reference images not displayed]

FINDINGS: Cardiac shadows within normal limits. Minimal scarring is noted in
the left base. No focal infiltrate or effusion is seen. Degenerative
changes of the thoracic spine are noted.
IMPRESSION: Stable left basilar scarring.  No acute abnormality is noted.

## 2021-12-16 DIAGNOSIS — C61 Malignant neoplasm of prostate: Secondary | ICD-10-CM | POA: Diagnosis not present

## 2021-12-23 DIAGNOSIS — R350 Frequency of micturition: Secondary | ICD-10-CM | POA: Diagnosis not present

## 2021-12-23 DIAGNOSIS — C61 Malignant neoplasm of prostate: Secondary | ICD-10-CM | POA: Diagnosis not present

## 2021-12-23 DIAGNOSIS — N401 Enlarged prostate with lower urinary tract symptoms: Secondary | ICD-10-CM | POA: Diagnosis not present

## 2022-01-05 DIAGNOSIS — Z23 Encounter for immunization: Secondary | ICD-10-CM | POA: Diagnosis not present

## 2022-01-05 DIAGNOSIS — E785 Hyperlipidemia, unspecified: Secondary | ICD-10-CM | POA: Diagnosis not present

## 2022-01-05 DIAGNOSIS — E1169 Type 2 diabetes mellitus with other specified complication: Secondary | ICD-10-CM | POA: Diagnosis not present

## 2022-01-05 DIAGNOSIS — G4733 Obstructive sleep apnea (adult) (pediatric): Secondary | ICD-10-CM | POA: Diagnosis not present

## 2022-01-05 DIAGNOSIS — K219 Gastro-esophageal reflux disease without esophagitis: Secondary | ICD-10-CM | POA: Diagnosis not present

## 2022-03-24 DIAGNOSIS — C61 Malignant neoplasm of prostate: Secondary | ICD-10-CM | POA: Diagnosis not present

## 2022-07-11 DIAGNOSIS — G4733 Obstructive sleep apnea (adult) (pediatric): Secondary | ICD-10-CM | POA: Diagnosis not present

## 2022-07-14 DIAGNOSIS — C61 Malignant neoplasm of prostate: Secondary | ICD-10-CM | POA: Diagnosis not present

## 2022-07-14 DIAGNOSIS — K219 Gastro-esophageal reflux disease without esophagitis: Secondary | ICD-10-CM | POA: Diagnosis not present

## 2022-07-14 DIAGNOSIS — E1169 Type 2 diabetes mellitus with other specified complication: Secondary | ICD-10-CM | POA: Diagnosis not present

## 2022-07-14 DIAGNOSIS — G4733 Obstructive sleep apnea (adult) (pediatric): Secondary | ICD-10-CM | POA: Diagnosis not present

## 2022-08-05 DIAGNOSIS — C61 Malignant neoplasm of prostate: Secondary | ICD-10-CM | POA: Diagnosis not present

## 2022-08-15 DIAGNOSIS — C61 Malignant neoplasm of prostate: Secondary | ICD-10-CM | POA: Diagnosis not present

## 2022-08-15 DIAGNOSIS — R350 Frequency of micturition: Secondary | ICD-10-CM | POA: Diagnosis not present

## 2022-08-15 DIAGNOSIS — N401 Enlarged prostate with lower urinary tract symptoms: Secondary | ICD-10-CM | POA: Diagnosis not present

## 2022-10-03 DIAGNOSIS — G4733 Obstructive sleep apnea (adult) (pediatric): Secondary | ICD-10-CM | POA: Diagnosis not present

## 2023-01-18 DIAGNOSIS — C61 Malignant neoplasm of prostate: Secondary | ICD-10-CM | POA: Diagnosis not present

## 2023-01-18 DIAGNOSIS — G4733 Obstructive sleep apnea (adult) (pediatric): Secondary | ICD-10-CM | POA: Diagnosis not present

## 2023-01-18 DIAGNOSIS — E1169 Type 2 diabetes mellitus with other specified complication: Secondary | ICD-10-CM | POA: Diagnosis not present

## 2023-01-18 DIAGNOSIS — K219 Gastro-esophageal reflux disease without esophagitis: Secondary | ICD-10-CM | POA: Diagnosis not present

## 2023-01-18 DIAGNOSIS — Z23 Encounter for immunization: Secondary | ICD-10-CM | POA: Diagnosis not present

## 2023-02-09 DIAGNOSIS — N401 Enlarged prostate with lower urinary tract symptoms: Secondary | ICD-10-CM | POA: Diagnosis not present

## 2023-02-09 DIAGNOSIS — R350 Frequency of micturition: Secondary | ICD-10-CM | POA: Diagnosis not present

## 2023-02-15 DIAGNOSIS — H903 Sensorineural hearing loss, bilateral: Secondary | ICD-10-CM | POA: Diagnosis not present

## 2023-04-04 DIAGNOSIS — G4733 Obstructive sleep apnea (adult) (pediatric): Secondary | ICD-10-CM | POA: Diagnosis not present

## 2023-04-26 DIAGNOSIS — N185 Chronic kidney disease, stage 5: Secondary | ICD-10-CM | POA: Diagnosis not present

## 2023-04-26 DIAGNOSIS — I120 Hypertensive chronic kidney disease with stage 5 chronic kidney disease or end stage renal disease: Secondary | ICD-10-CM | POA: Diagnosis not present

## 2023-04-26 DIAGNOSIS — E1122 Type 2 diabetes mellitus with diabetic chronic kidney disease: Secondary | ICD-10-CM | POA: Diagnosis not present

## 2023-04-26 DIAGNOSIS — Z905 Acquired absence of kidney: Secondary | ICD-10-CM | POA: Diagnosis not present

## 2023-07-19 DIAGNOSIS — K219 Gastro-esophageal reflux disease without esophagitis: Secondary | ICD-10-CM | POA: Diagnosis not present

## 2023-07-19 DIAGNOSIS — E7800 Pure hypercholesterolemia, unspecified: Secondary | ICD-10-CM | POA: Diagnosis not present

## 2023-07-19 DIAGNOSIS — E1169 Type 2 diabetes mellitus with other specified complication: Secondary | ICD-10-CM | POA: Diagnosis not present

## 2023-07-19 DIAGNOSIS — C61 Malignant neoplasm of prostate: Secondary | ICD-10-CM | POA: Diagnosis not present

## 2023-07-21 DIAGNOSIS — E669 Obesity, unspecified: Secondary | ICD-10-CM | POA: Diagnosis not present

## 2023-07-21 DIAGNOSIS — E1165 Type 2 diabetes mellitus with hyperglycemia: Secondary | ICD-10-CM | POA: Diagnosis not present

## 2023-07-21 DIAGNOSIS — S83104D Unspecified dislocation of right knee, subsequent encounter: Secondary | ICD-10-CM | POA: Diagnosis not present

## 2023-07-21 DIAGNOSIS — I120 Hypertensive chronic kidney disease with stage 5 chronic kidney disease or end stage renal disease: Secondary | ICD-10-CM | POA: Diagnosis not present

## 2023-07-21 DIAGNOSIS — I10 Essential (primary) hypertension: Secondary | ICD-10-CM | POA: Diagnosis not present

## 2023-07-21 DIAGNOSIS — E1142 Type 2 diabetes mellitus with diabetic polyneuropathy: Secondary | ICD-10-CM | POA: Diagnosis not present

## 2023-07-21 DIAGNOSIS — E11610 Type 2 diabetes mellitus with diabetic neuropathic arthropathy: Secondary | ICD-10-CM | POA: Diagnosis not present

## 2023-07-21 DIAGNOSIS — N185 Chronic kidney disease, stage 5: Secondary | ICD-10-CM | POA: Diagnosis not present

## 2023-07-27 DIAGNOSIS — D649 Anemia, unspecified: Secondary | ICD-10-CM | POA: Diagnosis not present

## 2023-08-23 DIAGNOSIS — E1122 Type 2 diabetes mellitus with diabetic chronic kidney disease: Secondary | ICD-10-CM | POA: Diagnosis not present

## 2023-08-23 DIAGNOSIS — Z794 Long term (current) use of insulin: Secondary | ICD-10-CM | POA: Diagnosis not present

## 2023-08-23 DIAGNOSIS — E1165 Type 2 diabetes mellitus with hyperglycemia: Secondary | ICD-10-CM | POA: Diagnosis not present

## 2023-08-23 DIAGNOSIS — N185 Chronic kidney disease, stage 5: Secondary | ICD-10-CM | POA: Diagnosis not present

## 2023-08-30 DIAGNOSIS — I120 Hypertensive chronic kidney disease with stage 5 chronic kidney disease or end stage renal disease: Secondary | ICD-10-CM | POA: Diagnosis not present

## 2023-08-30 DIAGNOSIS — Z905 Acquired absence of kidney: Secondary | ICD-10-CM | POA: Diagnosis not present

## 2023-08-30 DIAGNOSIS — E1122 Type 2 diabetes mellitus with diabetic chronic kidney disease: Secondary | ICD-10-CM | POA: Diagnosis not present

## 2023-08-30 DIAGNOSIS — N185 Chronic kidney disease, stage 5: Secondary | ICD-10-CM | POA: Diagnosis not present

## 2023-09-01 DIAGNOSIS — E039 Hypothyroidism, unspecified: Secondary | ICD-10-CM | POA: Diagnosis not present

## 2023-09-01 DIAGNOSIS — Z992 Dependence on renal dialysis: Secondary | ICD-10-CM | POA: Diagnosis not present

## 2023-09-01 DIAGNOSIS — G20A1 Parkinson's disease without dyskinesia, without mention of fluctuations: Secondary | ICD-10-CM | POA: Diagnosis not present

## 2023-09-01 DIAGNOSIS — N186 End stage renal disease: Secondary | ICD-10-CM | POA: Diagnosis not present

## 2023-09-01 DIAGNOSIS — D631 Anemia in chronic kidney disease: Secondary | ICD-10-CM | POA: Diagnosis not present

## 2023-09-01 DIAGNOSIS — N185 Chronic kidney disease, stage 5: Secondary | ICD-10-CM | POA: Diagnosis not present

## 2023-09-01 DIAGNOSIS — F329 Major depressive disorder, single episode, unspecified: Secondary | ICD-10-CM | POA: Diagnosis not present

## 2023-09-01 DIAGNOSIS — I120 Hypertensive chronic kidney disease with stage 5 chronic kidney disease or end stage renal disease: Secondary | ICD-10-CM | POA: Diagnosis not present

## 2023-09-01 DIAGNOSIS — Z7901 Long term (current) use of anticoagulants: Secondary | ICD-10-CM | POA: Diagnosis not present

## 2023-09-01 DIAGNOSIS — F411 Generalized anxiety disorder: Secondary | ICD-10-CM | POA: Diagnosis not present

## 2023-09-01 DIAGNOSIS — E871 Hypo-osmolality and hyponatremia: Secondary | ICD-10-CM | POA: Diagnosis not present

## 2023-09-01 DIAGNOSIS — Z794 Long term (current) use of insulin: Secondary | ICD-10-CM | POA: Diagnosis not present

## 2023-09-01 DIAGNOSIS — I129 Hypertensive chronic kidney disease with stage 1 through stage 4 chronic kidney disease, or unspecified chronic kidney disease: Secondary | ICD-10-CM | POA: Diagnosis not present

## 2023-09-01 DIAGNOSIS — R609 Edema, unspecified: Secondary | ICD-10-CM | POA: Diagnosis not present

## 2023-09-01 DIAGNOSIS — Z792 Long term (current) use of antibiotics: Secondary | ICD-10-CM | POA: Diagnosis not present

## 2023-09-01 DIAGNOSIS — M7989 Other specified soft tissue disorders: Secondary | ICD-10-CM | POA: Diagnosis not present

## 2023-09-01 DIAGNOSIS — Z7985 Long-term (current) use of injectable non-insulin antidiabetic drugs: Secondary | ICD-10-CM | POA: Diagnosis not present

## 2023-09-01 DIAGNOSIS — E8770 Fluid overload, unspecified: Secondary | ICD-10-CM | POA: Diagnosis not present

## 2023-09-01 DIAGNOSIS — E1142 Type 2 diabetes mellitus with diabetic polyneuropathy: Secondary | ICD-10-CM | POA: Diagnosis not present

## 2023-09-01 DIAGNOSIS — C642 Malignant neoplasm of left kidney, except renal pelvis: Secondary | ICD-10-CM | POA: Diagnosis not present

## 2023-09-01 DIAGNOSIS — E872 Acidosis, unspecified: Secondary | ICD-10-CM | POA: Diagnosis not present

## 2023-09-01 DIAGNOSIS — E1122 Type 2 diabetes mellitus with diabetic chronic kidney disease: Secondary | ICD-10-CM | POA: Diagnosis not present

## 2023-09-01 DIAGNOSIS — T82898A Other specified complication of vascular prosthetic devices, implants and grafts, initial encounter: Secondary | ICD-10-CM | POA: Diagnosis not present

## 2023-09-01 DIAGNOSIS — K219 Gastro-esophageal reflux disease without esophagitis: Secondary | ICD-10-CM | POA: Diagnosis not present

## 2023-09-01 DIAGNOSIS — E669 Obesity, unspecified: Secondary | ICD-10-CM | POA: Diagnosis not present

## 2023-09-01 DIAGNOSIS — R600 Localized edema: Secondary | ICD-10-CM | POA: Diagnosis not present

## 2023-09-01 DIAGNOSIS — E11610 Type 2 diabetes mellitus with diabetic neuropathic arthropathy: Secondary | ICD-10-CM | POA: Diagnosis not present

## 2023-09-01 DIAGNOSIS — R0602 Shortness of breath: Secondary | ICD-10-CM | POA: Diagnosis not present

## 2023-09-01 DIAGNOSIS — E782 Mixed hyperlipidemia: Secondary | ICD-10-CM | POA: Diagnosis not present

## 2023-09-01 DIAGNOSIS — G250 Essential tremor: Secondary | ICD-10-CM | POA: Diagnosis not present

## 2023-09-02 DIAGNOSIS — Z992 Dependence on renal dialysis: Secondary | ICD-10-CM | POA: Diagnosis not present

## 2023-09-02 DIAGNOSIS — C642 Malignant neoplasm of left kidney, except renal pelvis: Secondary | ICD-10-CM | POA: Diagnosis not present

## 2023-09-02 DIAGNOSIS — N186 End stage renal disease: Secondary | ICD-10-CM | POA: Diagnosis not present

## 2023-09-02 DIAGNOSIS — T82898A Other specified complication of vascular prosthetic devices, implants and grafts, initial encounter: Secondary | ICD-10-CM | POA: Diagnosis not present

## 2023-09-03 DIAGNOSIS — C642 Malignant neoplasm of left kidney, except renal pelvis: Secondary | ICD-10-CM | POA: Diagnosis not present

## 2023-09-03 DIAGNOSIS — T82898A Other specified complication of vascular prosthetic devices, implants and grafts, initial encounter: Secondary | ICD-10-CM | POA: Diagnosis not present

## 2023-09-03 DIAGNOSIS — Z992 Dependence on renal dialysis: Secondary | ICD-10-CM | POA: Diagnosis not present

## 2023-09-03 DIAGNOSIS — N186 End stage renal disease: Secondary | ICD-10-CM | POA: Diagnosis not present

## 2023-09-04 DIAGNOSIS — D631 Anemia in chronic kidney disease: Secondary | ICD-10-CM | POA: Diagnosis not present

## 2023-09-04 DIAGNOSIS — R600 Localized edema: Secondary | ICD-10-CM | POA: Diagnosis not present

## 2023-09-04 DIAGNOSIS — I120 Hypertensive chronic kidney disease with stage 5 chronic kidney disease or end stage renal disease: Secondary | ICD-10-CM | POA: Diagnosis not present

## 2023-09-04 DIAGNOSIS — Z992 Dependence on renal dialysis: Secondary | ICD-10-CM | POA: Diagnosis not present

## 2023-09-04 DIAGNOSIS — N186 End stage renal disease: Secondary | ICD-10-CM | POA: Diagnosis not present

## 2023-09-05 DIAGNOSIS — I120 Hypertensive chronic kidney disease with stage 5 chronic kidney disease or end stage renal disease: Secondary | ICD-10-CM | POA: Diagnosis not present

## 2023-09-05 DIAGNOSIS — D631 Anemia in chronic kidney disease: Secondary | ICD-10-CM | POA: Diagnosis not present

## 2023-09-05 DIAGNOSIS — Z992 Dependence on renal dialysis: Secondary | ICD-10-CM | POA: Diagnosis not present

## 2023-09-05 DIAGNOSIS — N186 End stage renal disease: Secondary | ICD-10-CM | POA: Diagnosis not present

## 2023-09-06 DIAGNOSIS — I120 Hypertensive chronic kidney disease with stage 5 chronic kidney disease or end stage renal disease: Secondary | ICD-10-CM | POA: Diagnosis not present

## 2023-09-06 DIAGNOSIS — Z992 Dependence on renal dialysis: Secondary | ICD-10-CM | POA: Diagnosis not present

## 2023-09-06 DIAGNOSIS — D631 Anemia in chronic kidney disease: Secondary | ICD-10-CM | POA: Diagnosis not present

## 2023-09-06 DIAGNOSIS — N186 End stage renal disease: Secondary | ICD-10-CM | POA: Diagnosis not present

## 2023-09-07 DIAGNOSIS — D631 Anemia in chronic kidney disease: Secondary | ICD-10-CM | POA: Diagnosis not present

## 2023-09-07 DIAGNOSIS — Z992 Dependence on renal dialysis: Secondary | ICD-10-CM | POA: Diagnosis not present

## 2023-09-07 DIAGNOSIS — N186 End stage renal disease: Secondary | ICD-10-CM | POA: Diagnosis not present

## 2023-09-07 DIAGNOSIS — I120 Hypertensive chronic kidney disease with stage 5 chronic kidney disease or end stage renal disease: Secondary | ICD-10-CM | POA: Diagnosis not present

## 2023-09-08 DIAGNOSIS — N186 End stage renal disease: Secondary | ICD-10-CM | POA: Diagnosis not present

## 2023-09-08 DIAGNOSIS — E8770 Fluid overload, unspecified: Secondary | ICD-10-CM | POA: Diagnosis not present

## 2023-09-08 DIAGNOSIS — I129 Hypertensive chronic kidney disease with stage 1 through stage 4 chronic kidney disease, or unspecified chronic kidney disease: Secondary | ICD-10-CM | POA: Diagnosis not present

## 2023-09-08 DIAGNOSIS — E1122 Type 2 diabetes mellitus with diabetic chronic kidney disease: Secondary | ICD-10-CM | POA: Diagnosis not present

## 2023-09-08 DIAGNOSIS — Z992 Dependence on renal dialysis: Secondary | ICD-10-CM | POA: Diagnosis not present

## 2023-09-08 DIAGNOSIS — M7989 Other specified soft tissue disorders: Secondary | ICD-10-CM | POA: Diagnosis not present

## 2023-09-09 DIAGNOSIS — M7989 Other specified soft tissue disorders: Secondary | ICD-10-CM | POA: Diagnosis not present

## 2023-09-09 DIAGNOSIS — I129 Hypertensive chronic kidney disease with stage 1 through stage 4 chronic kidney disease, or unspecified chronic kidney disease: Secondary | ICD-10-CM | POA: Diagnosis not present

## 2023-09-09 DIAGNOSIS — Z992 Dependence on renal dialysis: Secondary | ICD-10-CM | POA: Diagnosis not present

## 2023-09-09 DIAGNOSIS — E8770 Fluid overload, unspecified: Secondary | ICD-10-CM | POA: Diagnosis not present

## 2023-09-09 DIAGNOSIS — R600 Localized edema: Secondary | ICD-10-CM | POA: Diagnosis not present

## 2023-09-09 DIAGNOSIS — E1122 Type 2 diabetes mellitus with diabetic chronic kidney disease: Secondary | ICD-10-CM | POA: Diagnosis not present

## 2023-09-09 DIAGNOSIS — I120 Hypertensive chronic kidney disease with stage 5 chronic kidney disease or end stage renal disease: Secondary | ICD-10-CM | POA: Diagnosis not present

## 2023-09-09 DIAGNOSIS — N186 End stage renal disease: Secondary | ICD-10-CM | POA: Diagnosis not present

## 2023-10-21 DIAGNOSIS — R0981 Nasal congestion: Secondary | ICD-10-CM | POA: Diagnosis not present

## 2023-10-21 DIAGNOSIS — J0140 Acute pansinusitis, unspecified: Secondary | ICD-10-CM | POA: Diagnosis not present

## 2023-11-13 DIAGNOSIS — M25511 Pain in right shoulder: Secondary | ICD-10-CM | POA: Diagnosis not present

## 2023-11-17 DIAGNOSIS — M7541 Impingement syndrome of right shoulder: Secondary | ICD-10-CM | POA: Diagnosis not present

## 2023-11-17 DIAGNOSIS — M7551 Bursitis of right shoulder: Secondary | ICD-10-CM | POA: Diagnosis not present

## 2023-12-27 DIAGNOSIS — M25511 Pain in right shoulder: Secondary | ICD-10-CM | POA: Diagnosis not present
